# Patient Record
Sex: Female | Born: 1945
Health system: Southern US, Community
[De-identification: ages and names within clinical notes are randomized; demographics above are authoritative.]

## PROBLEM LIST (undated history)

## (undated) DIAGNOSIS — C482 Malignant neoplasm of peritoneum, unspecified: Secondary | ICD-10-CM

## (undated) DIAGNOSIS — M810 Age-related osteoporosis without current pathological fracture: Secondary | ICD-10-CM

## (undated) DIAGNOSIS — E079 Disorder of thyroid, unspecified: Secondary | ICD-10-CM

## (undated) DIAGNOSIS — C50919 Malignant neoplasm of unspecified site of unspecified female breast: Secondary | ICD-10-CM

## (undated) DIAGNOSIS — E7800 Pure hypercholesterolemia, unspecified: Secondary | ICD-10-CM

## (undated) DIAGNOSIS — K56609 Unspecified intestinal obstruction, unspecified as to partial versus complete obstruction: Secondary | ICD-10-CM

## (undated) DIAGNOSIS — H269 Unspecified cataract: Secondary | ICD-10-CM

## (undated) DIAGNOSIS — D126 Benign neoplasm of colon, unspecified: Secondary | ICD-10-CM

## (undated) DIAGNOSIS — Z1371 Encounter for nonprocreative screening for genetic disease carrier status: Secondary | ICD-10-CM

## (undated) DIAGNOSIS — K219 Gastro-esophageal reflux disease without esophagitis: Secondary | ICD-10-CM

## (undated) HISTORY — DX: Benign neoplasm of colon, unspecified: D12.6

## (undated) HISTORY — DX: Disorder of thyroid, unspecified: E07.9

## (undated) HISTORY — PX: COLONOSCOPY: SHX174

## (undated) HISTORY — PX: MASTECTOMY: SHX3

## (undated) HISTORY — DX: Malignant neoplasm of unspecified site of unspecified female breast: C50.919

## (undated) HISTORY — DX: Gastro-esophageal reflux disease without esophagitis: K21.9

## (undated) HISTORY — DX: Age-related osteoporosis without current pathological fracture: M81.0

## (undated) HISTORY — DX: Unspecified cataract: H26.9

## (undated) HISTORY — PX: OTHER SURGICAL HISTORY: SHX169

## (undated) HISTORY — DX: Malignant neoplasm of peritoneum, unspecified: C48.2

## (undated) HISTORY — DX: Unspecified intestinal obstruction, unspecified as to partial versus complete obstruction: K56.609

## (undated) HISTORY — PX: UPPER GASTROINTESTINAL ENDOSCOPY: SHX188

## (undated) HISTORY — DX: Pure hypercholesterolemia, unspecified: E78.00

## (undated) HISTORY — DX: Encounter for nonprocreative screening for genetic disease carrier status: Z13.71

---

## 2001-09-08 HISTORY — PX: BREAST LUMPECTOMY: SHX2

## 2002-03-10 ENCOUNTER — Other Ambulatory Visit: Admission: RE | Admit: 2002-03-10 | Discharge: 2002-03-10 | Payer: Self-pay | Admitting: General Surgery

## 2002-03-23 ENCOUNTER — Encounter: Admission: RE | Admit: 2002-03-23 | Discharge: 2002-03-23 | Payer: Self-pay | Admitting: General Surgery

## 2002-03-23 ENCOUNTER — Encounter: Payer: Self-pay | Admitting: General Surgery

## 2002-03-24 ENCOUNTER — Ambulatory Visit (HOSPITAL_BASED_OUTPATIENT_CLINIC_OR_DEPARTMENT_OTHER): Admission: RE | Admit: 2002-03-24 | Discharge: 2002-03-24 | Payer: Self-pay | Admitting: General Surgery

## 2002-03-24 ENCOUNTER — Encounter (INDEPENDENT_AMBULATORY_CARE_PROVIDER_SITE_OTHER): Payer: Self-pay | Admitting: *Deleted

## 2002-03-24 ENCOUNTER — Encounter: Payer: Self-pay | Admitting: General Surgery

## 2002-05-03 ENCOUNTER — Ambulatory Visit: Admission: RE | Admit: 2002-05-03 | Discharge: 2002-07-06 | Payer: Self-pay | Admitting: *Deleted

## 2003-06-21 ENCOUNTER — Other Ambulatory Visit: Admission: RE | Admit: 2003-06-21 | Discharge: 2003-06-21 | Payer: Self-pay | Admitting: Dermatology

## 2004-05-22 ENCOUNTER — Other Ambulatory Visit: Admission: RE | Admit: 2004-05-22 | Discharge: 2004-05-22 | Payer: Self-pay | Admitting: Dermatology

## 2005-10-22 ENCOUNTER — Other Ambulatory Visit: Admission: RE | Admit: 2005-10-22 | Discharge: 2005-10-22 | Payer: Self-pay | Admitting: Dermatology

## 2007-05-17 ENCOUNTER — Ambulatory Visit: Payer: Self-pay | Admitting: Gastroenterology

## 2007-05-31 ENCOUNTER — Ambulatory Visit: Payer: Self-pay | Admitting: Gastroenterology

## 2011-01-24 NOTE — Op Note (Signed)
[place]. [facility]  Patient:    Andrea Simpson, Andrea Simpson Visit Number: [id number] MRN: [id number]          Service Type: DSU Location: [facility] Attending Physician:  Carson Myrtle Dictated by:   Sheppard Plumber Earlene Plater, M.D. Proc. Date: 03/24/02 Admit Date:  03/24/2002 Discharge Date: 03/24/2002   CC:         Jeralyn Ruths, M.D.   Operative Report  PREOPERATIVE DIAGNOSIS:  Carcinoma, right breast.  POSTOPERATIVE DIAGNOSIS:  Carcinoma, right breast.  OPERATION PERFORMED:  Partial mastectomy right with pathology and margin management, sentinel lymph node biopsy, right with injection of Lymphazurin blue and lymph node mapping.  SURGEON:  Timothy E. Earlene Plater, M.D.  ANESTHESIA:  General.  INDICATIONS FOR PROCEDURE:  The patient is healthy, 78, just retired.  Has no personal health history.  She does have a history of fibrocystic disease and macrocysts.  She visited the office last week for a new mass in the right breast and aspiration cytology was done and was returned as ductal carcinoma. She and her family have been carefully counseled and she wishes to proceed with this proposed surgery.  She has had mammograms surprisingly which are both normal, right and left.  She has had a chest x-ray, chemistry profile and CBC, all of which were normal.  The patient was seen this morning by interventional radiology for injection of technetium in and around the right nipple area.  DESCRIPTION OF PROCEDURE:  The patient was seen and identified and the breast was marked by me and then taken to the operating room and general LMA anesthesia provided.  The right breast was carefully examined, the tumor in the 9 oclock position 6 cm outside the areolar edge was marked and the axilla was marked.  The Lymphazurin blue dye was then injected intradermally around and about the nipple areolar complex.  This was massaged for five minutes. Then the breast was carefully prepped and  draped in the usual fashion.  I approached the tumor in the breast by making a generous incision in the skin lines of the breast, dissected both the superficial and deep subcutaneous fat, grasped the tumor with the surrounding tissue and fat and completely excised the tumor mass from the surrounding tissue.  This was done sharply and without cautery.  It was removed, carefully marked and sent to pathology.  The remaining cavity was carefully examined.  Bleeding was controlled with the cautery and the wound was dry.  It was left for observation.  We then cleansed the skin and probed the right axilla with the Neoprobe, found one area high in the axilla with counts higher than background.  A small incision was made in the midaxilla, careful dissection into the axillary space and then careful direction by using the Neoprobe guided me to one single cluster of lymph nodes which were both stained and hot.  These were grasped and sharply and bluntly dissected from the surrounding tissue and removed from the axilla.  Cautery was used when necessary.  No nerve structures were encountered and the two lymph nodes together were sent to pathology marked as specimen #1, lymph node mass right axilla hot and blue.  The axilla was checked there was no bleeding. By then we had a report back from Dr. Delila Spence that the Touch Preps on the actual breast tumor were negative.  So, we began closing the partial mastectomy site with 3-0 Monocryl, 5-0 plain and 4-0 Monocryl.  Steri-Strips were applied.  By then  we had a report back that the sentinel nodes were also negative.  The axillary wound was closed in a like fashion.  The counts were all correct.  She tolerated it well.  Steri-Strips and dry sterile dressing were applied.  She was removed to recovery room in good condition.  The available information was transmitted to her family.  As well, she was given prescriptions for Phenergan for nausea, Percocet for pain.   She will be seen and followed as an outpatient. Dictated by:   Sheppard Plumber Earlene Plater, M.D. Attending Physician:  Carson Myrtle DD:  03/24/02 TD:  03/29/02 Job: [id number]

## 2012-03-15 ENCOUNTER — Encounter: Payer: Self-pay | Admitting: Gastroenterology

## 2012-05-03 ENCOUNTER — Ambulatory Visit (AMBULATORY_SURGERY_CENTER): Payer: Medicare Other

## 2012-05-03 VITALS — Ht 65.5 in | Wt 125.0 lb

## 2012-05-03 DIAGNOSIS — Z1211 Encounter for screening for malignant neoplasm of colon: Secondary | ICD-10-CM

## 2012-05-03 MED ORDER — MOVIPREP 100 G PO SOLR: 1.0000 | Freq: Once | ORAL | Status: DC

## 2012-05-17 ENCOUNTER — Encounter: Payer: Self-pay | Admitting: Gastroenterology

## 2012-05-27 ENCOUNTER — Encounter: Payer: Medicare Other | Admitting: Hematology and Oncology

## 2012-05-27 DIAGNOSIS — M899 Disorder of bone, unspecified: Secondary | ICD-10-CM

## 2012-05-27 DIAGNOSIS — M949 Disorder of cartilage, unspecified: Secondary | ICD-10-CM

## 2012-05-27 DIAGNOSIS — Z170 Estrogen receptor positive status [ER+]: Secondary | ICD-10-CM

## 2012-05-27 DIAGNOSIS — C50419 Malignant neoplasm of upper-outer quadrant of unspecified female breast: Secondary | ICD-10-CM

## 2012-05-27 DIAGNOSIS — M810 Age-related osteoporosis without current pathological fracture: Secondary | ICD-10-CM

## 2012-05-31 ENCOUNTER — Ambulatory Visit (AMBULATORY_SURGERY_CENTER): Payer: Medicare Other | Admitting: Gastroenterology

## 2012-05-31 VITALS — BP 148/74 | HR 75 | Temp 98.2°F | Resp 18 | Ht 65.5 in | Wt 122.0 lb

## 2012-05-31 DIAGNOSIS — Z800 Family history of malignant neoplasm of digestive organs: Secondary | ICD-10-CM

## 2012-05-31 DIAGNOSIS — Z1211 Encounter for screening for malignant neoplasm of colon: Secondary | ICD-10-CM

## 2012-05-31 MED ORDER — SODIUM CHLORIDE 0.9 % IV SOLN: 500.0000 mL | INTRAVENOUS | Status: DC

## 2012-05-31 NOTE — Op Note (Signed)
[facility] [address] [place], [address]   COLONOSCOPY PROCEDURE REPORT  PATIENT: Andrea Simpson, Andrea Simpson  MR#: [id number] BIRTHDATE: [birth year] , 66  yrs. old GENDER: Female ENDOSCOPIST: Mardella Layman, MD, Clementeen Graham REFERRED BY:  Eli Hose, M.D. PROCEDURE DATE:  05/31/2012 PROCEDURE:   Colonoscopy, diagnostic ASA CLASS:   Class II INDICATIONS:patient's immediate family history of colon cancer. MEDICATIONS: propofol (Diprivan) 150mg  IV  DESCRIPTION OF PROCEDURE:   After the risks and benefits and of the procedure were explained, informed consent was obtained.  A digital rectal exam revealed no abnormalities of the rectum.    The LB CF-[id number]  endoscope was introduced through the anus and advanced to the cecum, which was identified by both the appendix and ileocecal valve .  The quality of the prep was excellent, using MoviPrep .  The instrument was then slowly withdrawn as the colon was fully examined.     COLON FINDINGS: A normal appearing cecum, ileocecal valve, and appendiceal orifice were identified.  The ascending, hepatic flexure, transverse, splenic flexure, descending, sigmoid colon and rectum appeared unremarkable.  No polyps or cancers were seen. Retroflexed views revealed no abnormalities. Promonite perianal papillae noted.    The scope was then withdrawn from the patient and the procedure completed.  COMPLICATIONS: There were no complications. ENDOSCOPIC IMPRESSION:No polyps or cancer noted.... Normal colon  RECOMMENDATIONS: Given your significant family history of colon cancer, you should have a repeat colonoscopy in 5 years   REPEAT EXAM:  cc:  _______________________________ eSignedMardella Layman, MD, [facility] 05/31/2012 9:04 AM

## 2012-05-31 NOTE — Patient Instructions (Signed)
YOU HAD AN ENDOSCOPIC PROCEDURE TODAY AT THE [facility]: Refer to the procedure report that was given to you for any specific questions about what was found during the examination.  If the procedure report does not answer your questions, please call your gastroenterologist to clarify.  If you requested that your care partner not be given the details of your procedure findings, then the procedure report has been included in a sealed envelope for you to review at your convenience later.  YOU SHOULD EXPECT: Some feelings of bloating in the abdomen. Passage of more gas than usual.  Walking can help get rid of the air that was put into your GI tract during the procedure and reduce the bloating. If you had a lower endoscopy (such as a colonoscopy or flexible sigmoidoscopy) you may notice spotting of blood in your stool or on the toilet paper. If you underwent a bowel prep for your procedure, then you may not have a normal bowel movement for a few days.  DIET: Your first meal following the procedure should be a light meal and then it is ok to progress to your normal diet.  A half-sandwich or bowl of soup is an example of a good first meal.  Heavy or fried foods are harder to digest and may make you feel nauseous or bloated.  Likewise meals heavy in dairy and vegetables can cause extra gas to form and this can also increase the bloating.  Drink plenty of fluids but you should avoid alcoholic beverages for 24 hours.  ACTIVITY: Your care partner should take you home directly after the procedure.  You should plan to take it easy, moving slowly for the rest of the day.  You can resume normal activity the day after the procedure however you should NOT DRIVE or use heavy machinery for 24 hours (because of the sedation medicines used during the test).    SYMPTOMS TO REPORT IMMEDIATELY: A gastroenterologist can be reached at any hour.  During normal business hours, 8:30 AM to 5:00 PM Monday through Friday,  call [phone].  After hours and on weekends, please call the GI answering service at [phone] who will take a message and have the physician on call contact you.   Following lower endoscopy (colonoscopy or flexible sigmoidoscopy):  Excessive amounts of blood in the stool  Significant tenderness or worsening of abdominal pains  Swelling of the abdomen that is new, acute  Fever of 100F or higher    FOLLOW UP: If any biopsies were taken you will be contacted by phone or by letter within the next 1-3 weeks.  Call your gastroenterologist if you have not heard about the biopsies in 3 weeks.  Our staff will call the home number listed on your records the next business day following your procedure to check on you and address any questions or concerns that you may have at that time regarding the information given to you following your procedure. This is a courtesy call and so if there is no answer at the home number and we have not heard from you through the emergency physician on call, we will assume that you have returned to your regular daily activities without incident.  SIGNATURES/CONFIDENTIALITY: You and/or your care partner have signed paperwork which will be entered into your electronic medical record.  These signatures attest to the fact that that the information above on your After Visit Summary has been reviewed and is understood.  Full responsibility of the confidentiality   of this discharge information lies with you and/or your care-partner.     

## 2012-05-31 NOTE — Progress Notes (Signed)
Patient did not experience any of the following events: a burn prior to discharge; a fall within the facility; wrong site/side/patient/procedure/implant event; or a hospital transfer or hospital admission upon discharge from the facility. (G8907) Patient did not have preoperative order for IV antibiotic SSI prophylaxis. (G8918)  

## 2012-06-01 ENCOUNTER — Telehealth: Payer: Self-pay | Admitting: *Deleted

## 2012-06-01 DIAGNOSIS — C50919 Malignant neoplasm of unspecified site of unspecified female breast: Secondary | ICD-10-CM

## 2012-06-01 DIAGNOSIS — M810 Age-related osteoporosis without current pathological fracture: Secondary | ICD-10-CM

## 2012-06-01 NOTE — Telephone Encounter (Signed)
  Follow up Call-  Call back number 05/31/2012  Post procedure Call Back phone  # [phone] yes  Permission to leave phone message Yes     Patient questions:  Do you have a fever, pain , or abdominal swelling? no Pain Score  0 *  Have you tolerated food without any problems? yes  Have you been able to return to your normal activities? yes  Do you have any questions about your discharge instructions: Diet   no Medications  no Follow up visit  no  Do you have questions or concerns about your Care? no  Actions: * If pain score is 4 or above: No action needed, pain <4.

## 2013-09-08 DIAGNOSIS — C482 Malignant neoplasm of peritoneum, unspecified: Secondary | ICD-10-CM

## 2013-09-08 HISTORY — PX: OTHER SURGICAL HISTORY: SHX169

## 2013-09-08 HISTORY — DX: Malignant neoplasm of peritoneum, unspecified: C48.2

## 2013-09-08 HISTORY — PX: TOTAL ABDOMINAL HYSTERECTOMY W/ BILATERAL SALPINGOOPHORECTOMY: SHX83

## 2014-02-08 DIAGNOSIS — C482 Malignant neoplasm of peritoneum, unspecified: Secondary | ICD-10-CM | POA: Insufficient documentation

## 2014-05-30 DIAGNOSIS — T451X5A Adverse effect of antineoplastic and immunosuppressive drugs, initial encounter: Secondary | ICD-10-CM

## 2014-05-30 DIAGNOSIS — D701 Agranulocytosis secondary to cancer chemotherapy: Secondary | ICD-10-CM | POA: Insufficient documentation

## 2014-11-01 ENCOUNTER — Encounter: Payer: Self-pay | Admitting: Podiatry

## 2014-11-01 ENCOUNTER — Ambulatory Visit (INDEPENDENT_AMBULATORY_CARE_PROVIDER_SITE_OTHER): Payer: Medicare Other | Admitting: Podiatry

## 2014-11-01 VITALS — BP 100/70 | HR 96 | Resp 12

## 2014-11-01 DIAGNOSIS — L03012 Cellulitis of left finger: Secondary | ICD-10-CM | POA: Diagnosis not present

## 2014-11-01 NOTE — Progress Notes (Signed)
   Subjective:    Patient ID: Andrea Simpson, female    DOB: December 27, 1945, 69 y.o.   MRN: [id number]  HPI  PT STATED B/L GREAT TOENAIL BEEN PAINFUL 1 MONTH. THE TOENAILS ARE GETTING THICKER AND WORSE. TOENAILS GET AGGRAVATED BY WEARING SHOES. TRIED TO GET PEDICURE TO TRIM IT DOWN IT HELP SOME.  Review of Systems  Skin: Positive for color change.  All other systems reviewed and are negative.      Objective:   Physical Exam        Assessment & Plan:

## 2014-11-01 NOTE — Patient Instructions (Signed)

## 2015-04-24 DIAGNOSIS — Z1371 Encounter for nonprocreative screening for genetic disease carrier status: Secondary | ICD-10-CM | POA: Insufficient documentation

## 2016-04-30 ENCOUNTER — Other Ambulatory Visit (HOSPITAL_BASED_OUTPATIENT_CLINIC_OR_DEPARTMENT_OTHER): Payer: Medicare Other

## 2016-04-30 ENCOUNTER — Encounter: Payer: Self-pay | Admitting: Gynecologic Oncology

## 2016-04-30 ENCOUNTER — Ambulatory Visit: Payer: Medicare Other | Attending: Gynecologic Oncology | Admitting: Gynecologic Oncology

## 2016-04-30 VITALS — BP 135/69 | HR 64 | Temp 99.0°F | Resp 18 | Wt 129.4 lb

## 2016-04-30 DIAGNOSIS — Z923 Personal history of irradiation: Secondary | ICD-10-CM | POA: Diagnosis not present

## 2016-04-30 DIAGNOSIS — Z7982 Long term (current) use of aspirin: Secondary | ICD-10-CM | POA: Diagnosis not present

## 2016-04-30 DIAGNOSIS — Z888 Allergy status to other drugs, medicaments and biological substances status: Secondary | ICD-10-CM | POA: Diagnosis not present

## 2016-04-30 DIAGNOSIS — K219 Gastro-esophageal reflux disease without esophagitis: Secondary | ICD-10-CM | POA: Insufficient documentation

## 2016-04-30 DIAGNOSIS — Z800 Family history of malignant neoplasm of digestive organs: Secondary | ICD-10-CM | POA: Diagnosis not present

## 2016-04-30 DIAGNOSIS — Z880 Allergy status to penicillin: Secondary | ICD-10-CM | POA: Diagnosis not present

## 2016-04-30 DIAGNOSIS — Z170 Estrogen receptor positive status [ER+]: Secondary | ICD-10-CM | POA: Diagnosis not present

## 2016-04-30 DIAGNOSIS — E7800 Pure hypercholesterolemia, unspecified: Secondary | ICD-10-CM | POA: Insufficient documentation

## 2016-04-30 DIAGNOSIS — M810 Age-related osteoporosis without current pathological fracture: Secondary | ICD-10-CM | POA: Diagnosis not present

## 2016-04-30 DIAGNOSIS — Z881 Allergy status to other antibiotic agents status: Secondary | ICD-10-CM | POA: Insufficient documentation

## 2016-04-30 DIAGNOSIS — Z90710 Acquired absence of both cervix and uterus: Secondary | ICD-10-CM | POA: Insufficient documentation

## 2016-04-30 DIAGNOSIS — Z87891 Personal history of nicotine dependence: Secondary | ICD-10-CM | POA: Insufficient documentation

## 2016-04-30 DIAGNOSIS — Z853 Personal history of malignant neoplasm of breast: Secondary | ICD-10-CM | POA: Diagnosis not present

## 2016-04-30 DIAGNOSIS — C482 Malignant neoplasm of peritoneum, unspecified: Secondary | ICD-10-CM | POA: Insufficient documentation

## 2016-04-30 DIAGNOSIS — Z9221 Personal history of antineoplastic chemotherapy: Secondary | ICD-10-CM | POA: Diagnosis not present

## 2016-04-30 DIAGNOSIS — Z90722 Acquired absence of ovaries, bilateral: Secondary | ICD-10-CM | POA: Insufficient documentation

## 2016-04-30 DIAGNOSIS — Z9011 Acquired absence of right breast and nipple: Secondary | ICD-10-CM | POA: Insufficient documentation

## 2016-04-30 DIAGNOSIS — E785 Hyperlipidemia, unspecified: Secondary | ICD-10-CM | POA: Insufficient documentation

## 2016-04-30 NOTE — Patient Instructions (Signed)
We will contact you with the results of your CA 125 from today.  Plan to have a CT scan in three months with a follow up appointment with Dr. Alycia Rossetti at that time.  Please call for any new symptoms, concerns, or questions.

## 2016-04-30 NOTE — Progress Notes (Signed)
Consult Note: Gyn-Onc  Andrea Simpson 70 y.o. female  CC:  Chief Complaint  Patient presents with  . Peritoneal cancer    New Consultation    HPI: Patient is seen today as transferred care at the request of Dr. Tressie Stalker. Primary physician: [name].  Patient is a 70 year old gravida 0 who was diagnosed with a pelvic mass and an elevated CA-125 (60s per patient report) in May 2015. On 01/10/2014 she underwent a TAH/BSO. Findings included what appeared to be bilateral normal ovaries with an inflammatory mass involving the back of the uterus, the right round ligament and towards the right pelvic sidewall. The mass was apparently sent for frozen section was felt to be benign. Final pathology however revealed an adenocarcinoma with implants and soft tissue posterior to the uterus. Per the pathology report, the adnexa and fallopian tubes were negative. The tissue posterior to the uterus was a clear cell carcinoma. The tumor was ER negative, CK 7 positive. This was felt to be most consistent with a clear cell carcinoma.   Per the patient's report, she then developed a partial small bowel obstruction and went back to the operating room on May 13. At that time omentum was excised that was benign. She's been free of disease since that time.   She's had CA-125 was performed on a regular basis. Her most recent CA-125 June 27 was 10.1. She's also been undergoing annual CT scans of the abdomen and pelvis in November of every year. Her most recent CT scan 07/23/2015 revealed no evidence of peritoneal lesions or ascites. The patient was treated with 6 cycles of paclitaxel and carboplatin based chemotherapy which she completed from 03/27/2014 to 07/10/2014. She then underwent right pelvic radiation from 08/15/2014 to January 19 2016th. She has not been having routine pelvic exams and was somewhat shocked that she would have one today. She did have genetic testing and per her report she is BRCA negative. The  patient does have a personal history of breast cancer back in 2013. She underwent a right partial mastectomy which revealed invasive ductal carcinoma measuring 1.5 cm. The tumor was grade 3 with 2 negative sentinel lymph nodes. Tumor was ER positive PR negative HER-2 positive. Ki-67 marker was 20%. The patient declined chemotherapy and targeted therapy. She did undergo radiation to the right breast with 5040 cGy. She then took Arimidex for 5 years which she completed in October 2008.  Interval History:  She was last seen by her medical oncologist June 2017. At that time her review of systems and exam was unremarkable. CA-125 was normal. She scheduled for routine colonoscopy in September 2018.  Review of Systems: Constitutional: Denies fevers. Has 2-3 hot flashes per day. There is no pattern. She denies any night sweats. Skin: No rash Cardiovascular: No chest pain, shortness of breath, or edema  Pulmonary: No cough.  Gastro Intestinal: No nausea, vomiting, constipation, or diarrhea reported. No change in bowel movement. She denies early satiety or abdominal bloating. She states that she has 5 pounds that she cannot lose. She feels "thick" through the abdomen. She states her abdominal silhouette is not what it was many years ago. Genitourinary: Denies vaginal bleeding and discharge.  Musculoskeletal: No joint swelling or pain.  Neurologic: Persistent neuropathy in her nails of her fingers and in her feet. She has not. Her stools. She states she has very poor balance but then admits that this predated chemotherapy Psychology: No changes   Current Meds:  Outpatient Encounter Prescriptions as of  04/30/2016  Medication Sig  . aspirin 81 MG tablet Take 81 mg by mouth daily.  . Calcium Carbonate-Vitamin D (CALTRATE 600+D) 600-400 MG-UNIT per tablet Take 1 tablet by mouth daily.  Marland Kitchen EVISTA 60 MG tablet   . Multiple Vitamin (MULTIVITAMIN) tablet Take 1 tablet by mouth daily.  Marland Kitchen omeprazole (PRILOSEC) 20 MG  capsule daily as needed.   . simvastatin (ZOCOR) 10 MG tablet Take 10 mg by mouth at bedtime.  Marland Kitchen azelastine (ASTELIN) 0.1 % nasal spray Place into the nose 3 times/day as needed-between meals & bedtime.   . polyethylene glycol (MIRALAX / GLYCOLAX) packet Take by mouth.  . [DISCONTINUED] vitamin E 400 UNIT capsule Take 400 Units by mouth daily.  . [DISCONTINUED] Zoledronic Acid (ZOMETA IV) Inject into the vein.   No facility-administered encounter medications on file as of 04/30/2016.     Allergy:  Allergies  Allergen Reactions  . Penicillins     Felt like "pins and needles" on skin  . Tramadol Dermatitis    Itching , cough   . Keflex [Cephalexin] Rash    Social Hx:   Social History   Social History  . Marital status: Single    Spouse name: N/A  . Number of children: N/A  . Years of education: N/A   Occupational History  . Not on file.   Social History Main Topics  . Smoking status: Former Smoker    Quit date: 05/04/1978  . Smokeless tobacco: Never Used  . Alcohol use No  . Drug use: No  . Sexual activity: Not on file   Other Topics Concern  . Not on file   Social History Narrative  . No narrative on file    Past Surgical Hx:  Past Surgical History:  Procedure Laterality Date  . BREAST LUMPECTOMY     bilateral  . MASTECTOMY     [id number] right    Past Medical Hx:  Past Medical History:  Diagnosis Date  . BRCA negative   . Breast cancer ([place])   . GERD (gastroesophageal reflux disease)   . Hypercholesterolemia   . Osteoporosis     Oncology Hx:    Primary peritoneal carcinomatosis ([place])   01/10/2014 Initial Diagnosis    Primary peritoneal carcinomatosis ([place]), IIC      03/27/2014 - 07/10/2014 Chemotherapy    Status post 6 cycles of paclitaxel and carboplatin.       08/15/2014 - 09/26/2014 Radiation Therapy    5040 cGy in 28 fractions to the right pelvis.       Family Hx:  Family History  Problem Relation Age of Onset  . Colon cancer Father      Vitals:  Blood pressure 135/69, pulse 64, temperature 99 F (37.2 C), temperature source Oral, resp. rate 18, weight 129 lb 6.4 oz (58.7 kg), SpO2 99 %.  Physical Exam: Well-nourished well-developed female in no acute distress.  Neck: Supple, no no lymphadenopathy, no thyromegaly.  Lungs: Clear to auscultation bilaterally.  Cardiovascular: Regular rate and rhythm.  Abdomen: Well-healed vertical midline incision. Abdomen is soft, nontender, nondistended. There are no palpable masses or hepatosplenomegaly.  Groins: No lymphadenopathy.  Extremities no edema.  Pelvic: External genitalia within normal limits. Unidigital exam was performed. There are no masses or nodularity. The patient tolerated the exam very poorly. We could not perform a rectal secondary to patient tolerance.  Assessment/Plan: 70 year old with a history of both breast cancer as well as a presumed stage IIc primary peritoneal carcinoma. She was not adequately  staged. There was tumor on the posterior aspect of the uterus based on the operative note. This was consistent with clear cell carcinoma but the omentum was negative. There is no lymph node staging. She underwent 6 cycles of chemotherapy with paclitaxel and carboplatin followed by pelvic radiation therapy. She's been free of disease based primarily on CA-125's and CT scans.  We'll follow-up in results of her CA-125 from today. She'll return to see me in 3 months. It is her expectation that she would have a CT scan in November eery year for 5 years as that is the plan that was outlined by her prior medical oncologist. We will order CT scan for November.  I discussed with her the pelvic exams a part of the follow-up for ovarian cancer from an oncologic perspective. That we would attempt to perform a nature of her visits with her permission of course.  Her questions were elicited in answer to her satisfaction.  Raye Slyter A., MD 04/30/2016, 3:29 PM

## 2016-05-01 ENCOUNTER — Telehealth: Payer: Self-pay | Admitting: Gynecologic Oncology

## 2016-05-01 LAB — CA 125: Cancer Antigen (CA) 125: 10.3 U/mL (ref 0.0–38.1)

## 2016-05-01 NOTE — Telephone Encounter (Signed)
Left message for patient with CA 125 results.  Advised to call for any needs.

## 2016-07-18 ENCOUNTER — Encounter (HOSPITAL_COMMUNITY): Payer: Self-pay

## 2016-07-18 ENCOUNTER — Ambulatory Visit (HOSPITAL_COMMUNITY)
Admission: RE | Admit: 2016-07-18 | Discharge: 2016-07-18 | Disposition: A | Discharge status: Home / Self Care | Payer: Medicare Other | Source: Ambulatory Visit | Attending: Gynecologic Oncology | Admitting: Gynecologic Oncology

## 2016-07-18 DIAGNOSIS — K769 Liver disease, unspecified: Secondary | ICD-10-CM | POA: Diagnosis not present

## 2016-07-18 DIAGNOSIS — C482 Malignant neoplasm of peritoneum, unspecified: Secondary | ICD-10-CM

## 2016-07-18 DIAGNOSIS — Z08 Encounter for follow-up examination after completed treatment for malignant neoplasm: Secondary | ICD-10-CM | POA: Diagnosis not present

## 2016-07-18 DIAGNOSIS — Z8589 Personal history of malignant neoplasm of other organs and systems: Secondary | ICD-10-CM | POA: Diagnosis not present

## 2016-07-18 LAB — POCT I-STAT CREATININE: CREATININE: 0.9 mg/dL (ref 0.44–1.00)

## 2016-07-18 MED ORDER — IOPAMIDOL (ISOVUE-300) INJECTION 61%
100.0000 mL | Freq: Once | INTRAVENOUS | Status: AC | PRN
  Administered 2016-07-18: 100 mL via INTRAVENOUS

## 2016-07-22 ENCOUNTER — Other Ambulatory Visit: Payer: Medicare Other

## 2016-07-22 ENCOUNTER — Encounter: Payer: Self-pay | Admitting: Gynecologic Oncology

## 2016-07-22 ENCOUNTER — Ambulatory Visit: Payer: Medicare Other | Attending: Gynecologic Oncology | Admitting: Gynecologic Oncology

## 2016-07-22 ENCOUNTER — Other Ambulatory Visit: Payer: Self-pay | Admitting: Gynecologic Oncology

## 2016-07-22 VITALS — BP 142/77 | HR 86 | Temp 98.6°F | Resp 20 | Ht 65.5 in | Wt 129.7 lb

## 2016-07-22 DIAGNOSIS — C482 Malignant neoplasm of peritoneum, unspecified: Secondary | ICD-10-CM

## 2016-07-22 DIAGNOSIS — E7800 Pure hypercholesterolemia, unspecified: Secondary | ICD-10-CM | POA: Diagnosis not present

## 2016-07-22 DIAGNOSIS — Z87891 Personal history of nicotine dependence: Secondary | ICD-10-CM | POA: Insufficient documentation

## 2016-07-22 DIAGNOSIS — M810 Age-related osteoporosis without current pathological fracture: Secondary | ICD-10-CM | POA: Diagnosis not present

## 2016-07-22 DIAGNOSIS — Z8589 Personal history of malignant neoplasm of other organs and systems: Secondary | ICD-10-CM | POA: Diagnosis not present

## 2016-07-22 DIAGNOSIS — Z7982 Long term (current) use of aspirin: Secondary | ICD-10-CM | POA: Insufficient documentation

## 2016-07-22 DIAGNOSIS — C569 Malignant neoplasm of unspecified ovary: Secondary | ICD-10-CM

## 2016-07-22 DIAGNOSIS — Z853 Personal history of malignant neoplasm of breast: Secondary | ICD-10-CM | POA: Insufficient documentation

## 2016-07-22 DIAGNOSIS — K219 Gastro-esophageal reflux disease without esophagitis: Secondary | ICD-10-CM | POA: Insufficient documentation

## 2016-07-22 NOTE — Patient Instructions (Signed)
We will notify you of the results of your CA-125. Please return to see [place] in 4 months. Happy holidays!

## 2016-07-22 NOTE — Progress Notes (Signed)
Consult Note: Gyn-Onc  Andrea Simpson 70 y.o. female  CC:  Chief Complaint  Patient presents with  . peritoneal carcinoma ( [place] )    Follow up    HPI: Patient is seen today as transferred care at the request of Dr. Tressie Stalker. Primary physician: [name].  Patient is a 70 year old gravida 0 who was diagnosed with a pelvic mass and an elevated CA-125 (60s per patient report) in May 2015. On 01/10/2014 she underwent a TAH/BSO. Findings included what appeared to be bilateral normal ovaries with an inflammatory mass involving the back of the uterus, the right round ligament and towards the right pelvic sidewall. The mass was apparently sent for frozen section was felt to be benign. Final pathology however revealed an adenocarcinoma with implants and soft tissue posterior to the uterus. Per the pathology report, the adnexa and fallopian tubes were negative. The tissue posterior to the uterus was a clear cell carcinoma. The tumor was ER negative, CK 7 positive. This was felt to be most consistent with a clear cell carcinoma.   Per the patient's report, she then developed a partial small bowel obstruction and went back to the operating room on May 13. At that time omentum was excised that was benign. She's been free of disease since that time.   She's had CA-125 was performed on a regular basis. Her most recent CA-125 June 27 was 10.1. She's also been undergoing annual CT scans of the abdomen and pelvis in November of every year. Her most recent CT scan 07/23/2015 revealed no evidence of peritoneal lesions or ascites. The patient was treated with 6 cycles of paclitaxel and carboplatin based chemotherapy which she completed from 03/27/2014 to 07/10/2014. She then underwent right pelvic radiation from 08/15/2014 to January 19 2016th. She has not been having routine pelvic exams and was somewhat shocked that she would have one today. She did have genetic testing and per her report she is BRCA negative. The  patient does have a personal history of breast cancer back in 2013. She underwent a right partial mastectomy which revealed invasive ductal carcinoma measuring 1.5 cm. The tumor was grade 3 with 2 negative sentinel lymph nodes. Tumor was ER positive PR negative HER-2 positive. Ki-67 marker was 20%. The patient declined chemotherapy and targeted therapy. She did undergo radiation to the right breast with 5040 cGy. She then took Arimidex for 5 years which she completed in October 2008.  Interval History:  I last saw her 8/17. Her CA-125 was 10.3. CT 07/18/16:  IMPRESSION: Stable exam. No evidence of recurrent or metastatic disease within the abdomen or pelvis.  She's overall doing fairly well. She states that she "feels heavier" through the middle. She had her thyroid checked which was normal. She is wondering if thyroid replacement might help with her metabolism. Her bone density is improved. She had a bone density study done in October that showed that her back is now only at the stage of osteopenia though she continues to have osteoporosis in her femurs. She does exercise 3-4 days a week and we'll do a mile on a bike and a mile on the treadmill and then do some weight lifting. She states that she did qualify for cataract surgery but wants to put it off at this time. She'll be spending the holidays with her sister.  Review of Systems: Constitutional: Denies fevers, chills or headaches Skin: No rash Cardiovascular: No chest pain, shortness of breath, or edema  Pulmonary: No cough.  [name]  Intestinal: No nausea, vomiting, constipation, or diarrhea reported. No change in bowel movement. She denies early satiety or abdominal bloating. She feels "thick" through the abdomen.  Genitourinary: Denies vaginal bleeding and discharge.  Musculoskeletal: No joint swelling or pain.  Neurologic: Persistent neuropathy in her nails of her fingers and in her feet.She states she has very poor balance and poor  flexibility but then admits that this predated chemotherapy Psychology: No changes   Current Meds:  Outpatient Encounter Prescriptions as of 07/22/2016  Medication Sig  . aspirin 81 MG tablet Take 81 mg by mouth daily.  . Calcium Carbonate-Vitamin D (CALTRATE 600+D) 600-400 MG-UNIT per tablet Take 1 tablet by mouth daily.  Marland Kitchen EVISTA 60 MG tablet   . Multiple Vitamin (MULTIVITAMIN) tablet Take 1 tablet by mouth daily.  . polyethylene glycol (MIRALAX / GLYCOLAX) packet Take by mouth.  . simvastatin (ZOCOR) 10 MG tablet Take 10 mg by mouth at bedtime.  Marland Kitchen azelastine (ASTELIN) 0.1 % nasal spray Place into the nose 3 times/day as needed-between meals & bedtime.   Marland Kitchen omeprazole (PRILOSEC) 20 MG capsule daily as needed.    No facility-administered encounter medications on file as of 07/22/2016.     Allergy:  Allergies  Allergen Reactions  . Penicillins     Felt like "pins and needles" on skin  . Tramadol Dermatitis    Itching , cough   . Keflex [Cephalexin] Rash    Social Hx:   Social History   Social History  . Marital status: Single    Spouse name: N/A  . Number of children: N/A  . Years of education: N/A   Occupational History  . Not on file.   Social History Main Topics  . Smoking status: Former Smoker    Quit date: 05/04/1978  . Smokeless tobacco: Never Used  . Alcohol use No  . Drug use: No  . Sexual activity: Not on file   Other Topics Concern  . Not on file   Social History Narrative  . No narrative on file    Past Surgical Hx:  Past Surgical History:  Procedure Laterality Date  . BREAST LUMPECTOMY     bilateral  . MASTECTOMY     [id number] right    Past Medical Hx:  Past Medical History:  Diagnosis Date  . BRCA negative   . Breast cancer ([place])   . GERD (gastroesophageal reflux disease)   . Hypercholesterolemia   . Osteoporosis     Oncology Hx:    Primary peritoneal carcinomatosis ([place])   01/10/2014 Initial Diagnosis    Primary peritoneal  carcinomatosis ([place]), IIC      03/27/2014 - 07/10/2014 Chemotherapy    Status post 6 cycles of paclitaxel and carboplatin.       08/15/2014 - 09/26/2014 Radiation Therapy    5040 cGy in 28 fractions to the right pelvis.       Family Hx:  Family History  Problem Relation Age of Onset  . Colon cancer Father     Vitals:  Blood pressure (!) 142/77, pulse 86, temperature 98.6 F (37 C), temperature source Oral, resp. rate 20, height 5' 5.5" (1.664 m), weight 129 lb 11.2 oz (58.8 kg), SpO2 100 %.  Physical Exam: Well-nourished well-developed female in no acute distress.  Neck: Supple, no no lymphadenopathy, no thyromegaly.  Lungs: Clear to auscultation bilaterally.  Cardiovascular: Regular rate and rhythm.  Abdomen: Well-healed vertical midline incision. Abdomen is soft, nontender, nondistended. There are no palpable masses or hepatosplenomegaly. No fluid  wave, no appreciable hernia.  Groins: No lymphadenopathy.  Extremities no edema.  Pelvic: External genitalia within normal limits. Unidigital exam was performed. There are no masses or nodularity. The patient tolerated the exam very poorly. We could not perform a rectal secondary to patient tolerance and refusal.  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 She was not adequately staged. There was tumor on the posterior aspect of the uterus based on the operative note. This was consistent with clear cell carcinoma but the omentum was negative. There is no lymph node staging. She underwent 6 cycles of chemotherapy with paclitaxel and carboplatin followed by pelvic radiation therapy. She's been free of disease based primarily on CA-125's and CT scans.  We'll follow-up in results of her CA-125 from today. She'll return to see me in 3 months. It is her expectation that she would have a CT scan in November every year for 5 years as that  is the plan that was outlined by her prior medical oncologist. We will order CT scan for November 2018.  She will follow-up with a personal trainer at her gym to see if there's any exercises they can do to increase her stamina, stability, flexibility.  She will follow-up with her other physicians as scheduled.  Her questions were elicited in answer to her satisfaction.  Nancy Marus A., MD 07/22/2016, 12:48 PM

## 2016-07-23 LAB — CA 125: Cancer Antigen (CA) 125: 15.2 U/mL (ref 0.0–38.1)

## 2016-07-24 ENCOUNTER — Telehealth: Payer: Self-pay

## 2016-07-24 NOTE — Telephone Encounter (Signed)
Orders received from [facility], APNP to contact the patient with CA 125 level of ( 15.2 ) being WNL . The patient was contacted and updated with her CA 125 level being "WNL". Patient states understanding, denies further questions at this time.

## 2016-07-30 ENCOUNTER — Ambulatory Visit (HOSPITAL_COMMUNITY): Payer: Medicare Other

## 2016-07-30 ENCOUNTER — Ambulatory Visit: Payer: Medicare Other | Admitting: Gynecologic Oncology

## 2016-11-04 ENCOUNTER — Other Ambulatory Visit: Payer: Self-pay | Admitting: Gynecologic Oncology

## 2016-11-04 DIAGNOSIS — C482 Malignant neoplasm of peritoneum, unspecified: Secondary | ICD-10-CM

## 2016-11-04 NOTE — Progress Notes (Signed)
Consult Note: Gyn-Onc  Andrea Simpson 71 y.o. female  CC:  Chief Complaint  Patient presents with  . Peritoneal Cancer    HPI: Patient is seen today as transferred care at the request of Dr. Tressie Stalker. Primary physician: [name].  Patient is a 71 year old gravida 0 who was diagnosed with a pelvic mass and an elevated CA-125 (60s per patient report) in May 2015. On 01/10/2014 she underwent a TAH/BSO. Findings included what appeared to be bilateral normal ovaries with an inflammatory mass involving the back of the uterus, the right round ligament and towards the right pelvic sidewall. The mass was apparently sent for frozen section was felt to be benign. Final pathology however revealed an adenocarcinoma with implants and soft tissue posterior to the uterus. Per the pathology report, the adnexa and fallopian tubes were negative. The tissue posterior to the uterus was a clear cell carcinoma. The tumor was ER negative, CK 7 positive. This was felt to be most consistent with a clear cell carcinoma.   Per the patient's report, she then developed a partial small bowel obstruction and went back to the operating room on May 13. At that time omentum was excised that was benign. She's been free of disease since that time.   She's had CA-125 was performed on a regular basis. Her most recent CA-125 June 27 was 10.1. She's also been undergoing annual CT scans of the abdomen and pelvis in November of every year. Her most recent CT scan 07/23/2015 revealed no evidence of peritoneal lesions or ascites. The patient was treated with 6 cycles of paclitaxel and carboplatin based chemotherapy which she completed from 03/27/2014 to 07/10/2014. She then underwent right pelvic radiation from 08/15/2014 to January 19 2016th. She has not been having routine pelvic exams and was somewhat shocked that she would have one today. She did have genetic testing and per her report she is BRCA negative. The patient does have a  personal history of breast cancer back in 2013. She underwent a right partial mastectomy which revealed invasive ductal carcinoma measuring 1.5 cm. The tumor was grade 3 with 2 negative sentinel lymph nodes. Tumor was ER positive PR negative HER-2 positive. Ki-67 marker was 20%. The patient declined chemotherapy and targeted therapy. She did undergo radiation to the right breast with 5040 cGy. She then took Arimidex for 5 years which she completed in October 2008.  Interval History:  I last saw her 11/17. Her CA-125 was 15.2 CT 07/18/16:  IMPRESSION: Stable exam. No evidence of recurrent or metastatic disease within the abdomen or pelvis.  She comes in today for follow-up. She is with her CO2 5 last time was 15 and was up from 10 prior was reassured about her imaging study. She is frustrated that she is unable to lose weight which is never been an issue for her before. She went to make sure that we knew that she was off of her Evista and is only taking Prilosec when necessary. There are no new medical problems and her family. Should a mammogram in the fall of 2017.  Review of Systems: Constitutional: Denies fevers, chills or headaches Skin: No rash Cardiovascular: No chest pain, shortness of breath, or edema  Pulmonary: No cough.  Gastro Intestinal: No nausea, vomiting, constipation, or diarrhea reported. No change in bowel movement. She denies early satiety or abdominal bloating. She feels "thick" through the abdomen.  Genitourinary: Denies vaginal bleeding and discharge.  Musculoskeletal: No joint swelling or pain.  Neurologic: She states  she has very poor balance and poor flexibility. Psychology: No changes   Current Meds:  Outpatient Encounter Prescriptions as of 11/05/2016  Medication Sig  . aspirin 81 MG tablet Take 81 mg by mouth daily.  Marland Kitchen azelastine (ASTELIN) 0.1 % nasal spray Place into the nose 3 times/day as needed-between meals & bedtime.   . EVISTA 60 MG tablet   . Multiple  Vitamin (MULTIVITAMIN) tablet Take 1 tablet by mouth daily.  Marland Kitchen omeprazole (PRILOSEC) 20 MG capsule daily as needed.   . polyethylene glycol (MIRALAX / GLYCOLAX) packet Take by mouth.  . simvastatin (ZOCOR) 10 MG tablet Take 10 mg by mouth at bedtime.  . [DISCONTINUED] Calcium Carbonate-Vitamin D (CALTRATE 600+D) 600-400 MG-UNIT per tablet Take 1 tablet by mouth daily.   No facility-administered encounter medications on file as of 11/05/2016.     Allergy:  Allergies  Allergen Reactions  . Penicillins     Felt like "pins and needles" on skin  . Tramadol Dermatitis    Itching , cough   . Keflex [Cephalexin] Rash    Social Hx:   Social History   Social History  . Marital status: Single    Spouse name: N/A  . Number of children: N/A  . Years of education: N/A   Occupational History  . Not on file.   Social History Main Topics  . Smoking status: Former Smoker    Quit date: 05/04/1978  . Smokeless tobacco: Never Used  . Alcohol use No  . Drug use: No  . Sexual activity: Not on file   Other Topics Concern  . Not on file   Social History Narrative  . No narrative on file    Past Surgical Hx:  Past Surgical History:  Procedure Laterality Date  . BREAST LUMPECTOMY     bilateral  . MASTECTOMY     [id number] right    Past Medical Hx:  Past Medical History:  Diagnosis Date  . BRCA negative   . Breast cancer ([place])   . GERD (gastroesophageal reflux disease)   . Hypercholesterolemia   . Osteoporosis     Oncology Hx:    Primary peritoneal carcinomatosis ([place])   01/10/2014 Initial Diagnosis    Primary peritoneal carcinomatosis ([place]), IIC      03/27/2014 - 07/10/2014 Chemotherapy    Status post 6 cycles of paclitaxel and carboplatin.       08/15/2014 - 09/26/2014 Radiation Therapy    5040 cGy in 28 fractions to the right pelvis.       Family Hx:  Family History  Problem Relation Age of Onset  . Colon cancer Father     Vitals:  Blood pressure 140/78, pulse  80, temperature 98.1 F (36.7 C), temperature source Oral, resp. rate 17, height 5' 5.5" (1.664 m), weight 129 lb 4.8 oz (58.7 kg), SpO2 100 %.  Physical Exam: Well-nourished well-developed female in no acute distress.  Neck: Supple, no no lymphadenopathy, no thyromegaly.  Lungs: Clear to auscultation bilaterally.  Cardiovascular: Regular rate and rhythm.  Abdomen: Well-healed vertical midline incision. Abdomen is soft, nontender, nondistended. There are no palpable masses or hepatosplenomegaly. No fluid wave, no appreciable hernia.  Groins: No lymphadenopathy.  Extremities no edema.  Pelvic: External genitalia within normal limits. Unidigital exam was performed. There are no masses or nodularity. The patient tolerated the exam very poorly. We could not perform a rectal secondary to patient tolerance and refusal.  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 She was not adequately staged. There was tumor on the posterior aspect of the uterus based on the operative note. This was consistent with clear cell carcinoma but the omentum was negative. There is no lymph node staging. She underwent 6 cycles of chemotherapy with paclitaxel and carboplatin followed by pelvic radiation therapy. She's been free of disease based primarily on CA-125's and CT scans.  We'll follow-up in results of her CA-125 from today. She'll return to see me in 3 months. It is her expectation that she would have a CT scan in November every year for 5 years as that is the plan that was outlined by her prior medical oncologist. We will order CT scan for November 2018.  She will follow-up with her other physicians as scheduled.  Her questions were elicited in answer to her satisfaction.  Nancy Marus A., MD 11/05/2016, 12:32 PM

## 2016-11-05 ENCOUNTER — Ambulatory Visit: Payer: Medicare Other | Attending: Gynecologic Oncology | Admitting: Gynecologic Oncology

## 2016-11-05 ENCOUNTER — Encounter: Payer: Self-pay | Admitting: Gynecologic Oncology

## 2016-11-05 ENCOUNTER — Other Ambulatory Visit (HOSPITAL_BASED_OUTPATIENT_CLINIC_OR_DEPARTMENT_OTHER): Payer: Medicare Other

## 2016-11-05 VITALS — BP 140/78 | HR 80 | Temp 98.1°F | Resp 17 | Ht 65.5 in | Wt 129.3 lb

## 2016-11-05 DIAGNOSIS — M810 Age-related osteoporosis without current pathological fracture: Secondary | ICD-10-CM | POA: Diagnosis not present

## 2016-11-05 DIAGNOSIS — Z853 Personal history of malignant neoplasm of breast: Secondary | ICD-10-CM | POA: Insufficient documentation

## 2016-11-05 DIAGNOSIS — Z7982 Long term (current) use of aspirin: Secondary | ICD-10-CM | POA: Insufficient documentation

## 2016-11-05 DIAGNOSIS — C482 Malignant neoplasm of peritoneum, unspecified: Secondary | ICD-10-CM

## 2016-11-05 DIAGNOSIS — Z87891 Personal history of nicotine dependence: Secondary | ICD-10-CM | POA: Diagnosis not present

## 2016-11-05 DIAGNOSIS — K219 Gastro-esophageal reflux disease without esophagitis: Secondary | ICD-10-CM | POA: Diagnosis not present

## 2016-11-05 DIAGNOSIS — Z8589 Personal history of malignant neoplasm of other organs and systems: Secondary | ICD-10-CM | POA: Diagnosis not present

## 2016-11-05 DIAGNOSIS — E7800 Pure hypercholesterolemia, unspecified: Secondary | ICD-10-CM | POA: Diagnosis not present

## 2016-11-05 NOTE — Patient Instructions (Signed)
We will notify you of the results of your bloodwork from today. Please return to see [place] in 3 months.

## 2016-11-06 LAB — CA 125: CANCER ANTIGEN (CA) 125: 11.4 U/mL (ref 0.0–38.1)

## 2016-11-07 ENCOUNTER — Telehealth: Payer: Self-pay

## 2016-11-07 NOTE — Telephone Encounter (Signed)
Told Andrea Simpson  that her CA-125 was WNL at 56.4.

## 2017-03-04 ENCOUNTER — Encounter: Payer: Self-pay | Admitting: Gynecologic Oncology

## 2017-03-04 ENCOUNTER — Ambulatory Visit: Payer: Medicare Other | Attending: Gynecologic Oncology | Admitting: Gynecologic Oncology

## 2017-03-04 ENCOUNTER — Other Ambulatory Visit: Payer: Self-pay | Admitting: Gynecologic Oncology

## 2017-03-04 ENCOUNTER — Other Ambulatory Visit: Payer: Medicare Other

## 2017-03-04 ENCOUNTER — Encounter: Payer: Self-pay | Admitting: Internal Medicine

## 2017-03-04 VITALS — BP 133/80 | HR 66 | Temp 97.9°F | Resp 18 | Wt 129.0 lb

## 2017-03-04 DIAGNOSIS — R1909 Other intra-abdominal and pelvic swelling, mass and lump: Secondary | ICD-10-CM

## 2017-03-04 DIAGNOSIS — M25551 Pain in right hip: Secondary | ICD-10-CM | POA: Insufficient documentation

## 2017-03-04 DIAGNOSIS — M810 Age-related osteoporosis without current pathological fracture: Secondary | ICD-10-CM | POA: Insufficient documentation

## 2017-03-04 DIAGNOSIS — Z853 Personal history of malignant neoplasm of breast: Secondary | ICD-10-CM | POA: Diagnosis not present

## 2017-03-04 DIAGNOSIS — C786 Secondary malignant neoplasm of retroperitoneum and peritoneum: Secondary | ICD-10-CM | POA: Diagnosis present

## 2017-03-04 DIAGNOSIS — Z923 Personal history of irradiation: Secondary | ICD-10-CM | POA: Diagnosis not present

## 2017-03-04 DIAGNOSIS — E7800 Pure hypercholesterolemia, unspecified: Secondary | ICD-10-CM | POA: Diagnosis not present

## 2017-03-04 DIAGNOSIS — N951 Menopausal and female climacteric states: Secondary | ICD-10-CM | POA: Insufficient documentation

## 2017-03-04 DIAGNOSIS — Z880 Allergy status to penicillin: Secondary | ICD-10-CM | POA: Insufficient documentation

## 2017-03-04 DIAGNOSIS — Z9221 Personal history of antineoplastic chemotherapy: Secondary | ICD-10-CM

## 2017-03-04 DIAGNOSIS — Z87891 Personal history of nicotine dependence: Secondary | ICD-10-CM | POA: Diagnosis not present

## 2017-03-04 DIAGNOSIS — K56600 Partial intestinal obstruction, unspecified as to cause: Secondary | ICD-10-CM | POA: Diagnosis not present

## 2017-03-04 DIAGNOSIS — K219 Gastro-esophageal reflux disease without esophagitis: Secondary | ICD-10-CM | POA: Insufficient documentation

## 2017-03-04 DIAGNOSIS — Z79899 Other long term (current) drug therapy: Secondary | ICD-10-CM | POA: Insufficient documentation

## 2017-03-04 DIAGNOSIS — Z1371 Encounter for nonprocreative screening for genetic disease carrier status: Secondary | ICD-10-CM

## 2017-03-04 DIAGNOSIS — Z7982 Long term (current) use of aspirin: Secondary | ICD-10-CM | POA: Insufficient documentation

## 2017-03-04 DIAGNOSIS — Z8589 Personal history of malignant neoplasm of other organs and systems: Secondary | ICD-10-CM

## 2017-03-04 DIAGNOSIS — C482 Malignant neoplasm of peritoneum, unspecified: Secondary | ICD-10-CM

## 2017-03-04 NOTE — Progress Notes (Signed)
Consult Note: Gyn-Onc  Andrea Simpson 71 y.o. female  CC:  Chief Complaint  Patient presents with  . Primary Peritoneal Carcenomatosis    HPI: Patient is seen today as transferred care at the request of Dr. Tressie Stalker. Primary physician: Andrea Simpson.  Patient is a 72 year old gravida 0 who was diagnosed with a pelvic mass and an elevated CA-125 (60s per patient report) in May 2015. On 01/10/2014 she underwent a TAH/BSO. Findings included what appeared to be bilateral normal ovaries with an inflammatory mass involving the back of the uterus, the right round ligament and towards the right pelvic sidewall. The mass was apparently sent for frozen section was felt to be benign. Final pathology however revealed an adenocarcinoma with implants and soft tissue posterior to the uterus. Per the pathology report, the adnexa and fallopian tubes were negative. The tissue posterior to the uterus was a clear cell carcinoma. The tumor was ER negative, CK 7 positive. This was felt to be most consistent with a clear cell carcinoma.   Per the patient's report, she then developed a partial small bowel obstruction and went back to the operating room on May 13. At that time omentum was excised that was benign. She's been free of disease since that time.   She's had CA-125 was performed on a regular basis. Her most recent CA-125 June 27 was 10.1. She's also been undergoing annual CT scans of the abdomen and pelvis in November of every year. Her most recent CT scan 07/23/2015 revealed no evidence of peritoneal lesions or ascites. The patient was treated with 6 cycles of paclitaxel and carboplatin based chemotherapy which she completed from 03/27/2014 to 07/10/2014. She then underwent right pelvic radiation from 08/15/2014 to January 19 2016th. She has not been having routine pelvic exams and was somewhat shocked that she would have one today. She did have genetic testing and per her report she is BRCA negative. The patient  does have a personal history of breast cancer back in 2013. She underwent a right partial mastectomy which revealed invasive ductal carcinoma measuring 1.5 cm. The tumor was grade 3 with 2 negative sentinel lymph nodes. Tumor was ER positive PR negative HER-2 positive. Ki-67 marker was 20%. The patient declined chemotherapy and targeted therapy. She did undergo radiation to the right breast with 5040 cGy. She then took Arimidex for 5 years which she completed in October 2008.  Interval History:  I last saw her 11/17. Her CA-125 was 15.2 CT 07/18/16:  IMPRESSION: Stable exam. No evidence of recurrent or metastatic disease within the abdomen or pelvis.  She comes in today for follow-up. Her CA-125 last time was 11.4 when I saw her 2/18. She's overall doing fairly well. She has a biopsy-proven basal cell on her forehead and she's currently scheduled to have that removed. She continues to have hot flashes about 2-3 times a day. She's not sure is really changed significantly from the past but she started think it might be more prominent. She will be due for mammogram in September and will also be due for colonoscopy. She has some every 5 months. She made a comment being concerned about the colonoscopy secondary to blockage she had. I discussed with her that she had a partial small bowel obstruction postoperatively and that it was not related to her colon and it therefore is not really an issue at the time of her colonoscopy. She was very relieved to hear this. There are no new medical problems and her family. She's  been having some issues with right hip pain that went down to her knee. She has been told that his bursitis.  Review of Systems: Constitutional: Denies fevers, chills or headaches Skin: No rash Cardiovascular: No chest pain, shortness of breath, or edema  Pulmonary: No cough.  Gastro Intestinal: No nausea, vomiting, constipation, or diarrhea reported. She denies early satiety or abdominal  bloating. She feels "thick" through the abdomen.  Genitourinary: Denies vaginal bleeding and discharge.  Musculoskeletal: + right hip pain Neurologic: "hopes that she does not have sciatica" Psychology: No changes   Current Meds:  Outpatient Encounter Prescriptions as of 03/04/2017  Medication Sig  . aspirin 81 MG tablet Take 81 mg by mouth daily.  Marland Kitchen azelastine (ASTELIN) 0.1 % nasal spray Place into the nose 2 (two) times daily as needed.   . Calcium Carbonate-Vitamin D (CALCIUM 600+D) 600-400 MG-UNIT tablet Take 1 tablet by mouth daily.  Marland Kitchen denosumab (PROLIA) 60 MG/ML SOLN injection Inject 60 mg into the skin every 6 (six) months. Administer in upper arm, thigh, or abdomen  . levothyroxine (SYNTHROID, LEVOTHROID) 25 MCG tablet Take 12.5 mcg by mouth daily.  . Multiple Vitamin (MULTIVITAMIN) tablet Take 1 tablet by mouth daily.  Marland Kitchen omeprazole (PRILOSEC) 20 MG capsule as needed.   . polyethylene glycol (MIRALAX / GLYCOLAX) packet Take by mouth.  . simvastatin (ZOCOR) 10 MG tablet Take 10 mg by mouth at bedtime.   No facility-administered encounter medications on file as of 03/04/2017.     Allergy:  Allergies  Allergen Reactions  . Penicillins     Felt like "pins and needles" on skin  . Tramadol Dermatitis    Itching , cough   . Keflex [Cephalexin] Rash    Social Hx:   Social History   Social History  . Marital status: Single    Spouse name: N/A  . Number of children: N/A  . Years of education: N/A   Occupational History  . Not on file.   Social History Main Topics  . Smoking status: Former Smoker    Quit date: 05/04/1978  . Smokeless tobacco: Never Used  . Alcohol use No  . Drug use: No  . Sexual activity: Not on file   Other Topics Concern  . Not on file   Social History Narrative  . No narrative on file    Past Surgical Hx:  Past Surgical History:  Procedure Laterality Date  . BREAST LUMPECTOMY     bilateral  . MASTECTOMY     [id number] right    Past  Medical Hx:  Past Medical History:  Diagnosis Date  . BRCA negative   . Breast cancer ([place])   . GERD (gastroesophageal reflux disease)   . Hypercholesterolemia   . Osteoporosis     Oncology Hx:    Primary peritoneal carcinomatosis ([place])   01/10/2014 Initial Diagnosis    Primary peritoneal carcinomatosis ([place]), [facility]      03/27/2014 - 07/10/2014 Chemotherapy    Status post 6 cycles of paclitaxel and carboplatin.       08/15/2014 - 09/26/2014 Radiation Therapy    5040 cGy in 28 fractions to the right pelvis.       Family Hx:  Family History  Problem Relation Age of Onset  . Colon cancer Father     Vitals:  Blood pressure 133/80, pulse 66, temperature 97.9 F (36.6 C), resp. rate 18, weight 129 lb (58.5 kg), SpO2 100 %.  Physical Exam: Well-nourished well-developed female in no acute distress.  Neck: Supple, no no lymphadenopathy, no thyromegaly.  Lungs: Clear to auscultation bilaterally.  Cardiovascular: Regular rate and rhythm.  Abdomen: Well-healed vertical midline incision. Abdomen is soft, nontender, nondistended. There are no palpable masses or hepatosplenomegaly. No fluid wave, no appreciable hernia.  Groins: No lymphadenopathy.  Extremities no edema.  Pelvic: External genitalia within normal limits. Unidigital exam was performed. There are no masses or nodularity. The patient tolerated the exam very poorly. We could not perform a rectal secondary to patient tolerance and refusal.  Assessment/Plan: 71 year old with a history of both breast cancer as well as a presumed stage IIc primary peritoneal carcinoma diagnosed in 5/15. She was not comprehensively surgically staged. There was tumor on the posterior aspect of the uterus based on the operative note. This was consistent with clear cell carcinoma but the omentum was negative. There was no lymph node staging. She underwent 6 cycles of chemotherapy with paclitaxel and carboplatin followed by pelvic radiation therapy.  She's been free of disease based primarily on CA-125's and CT scans.  We'll follow-up in results of her CA-125 from today. She'll return to see me in 3 months. It is her expectation that she would have a CT scan in November every year for 5 years as that is the plan that was outlined by her prior medical oncologist. We will order CT scan for November 2018.  She will follow-up with her other physicians as scheduled.  Her questions were elicited in answer to her satisfaction.  Eisha Chatterjee A., MD 03/04/2017, 11:21 AM

## 2017-03-04 NOTE — Patient Instructions (Signed)
We will notify you of your results from today. Please return to see [place] in 4 months. We will obtain a CT scan at that time.

## 2017-03-05 ENCOUNTER — Telehealth: Payer: Self-pay

## 2017-03-05 LAB — CA 125: Cancer Antigen (CA) 125: 13 U/mL (ref 0.0–38.1)

## 2017-03-05 NOTE — Telephone Encounter (Signed)
Told Andrea Simpson that her CA-125 was good at 13.0 per Melissa Cross,NP.

## 2017-03-13 ENCOUNTER — Encounter: Payer: Self-pay | Admitting: Internal Medicine

## 2017-04-15 ENCOUNTER — Encounter: Payer: Self-pay | Admitting: *Deleted

## 2017-05-21 ENCOUNTER — Ambulatory Visit (INDEPENDENT_AMBULATORY_CARE_PROVIDER_SITE_OTHER): Payer: Medicare Other | Admitting: Internal Medicine

## 2017-05-21 ENCOUNTER — Encounter: Payer: Self-pay | Admitting: Internal Medicine

## 2017-05-21 ENCOUNTER — Encounter (INDEPENDENT_AMBULATORY_CARE_PROVIDER_SITE_OTHER): Payer: Self-pay

## 2017-05-21 VITALS — BP 114/70 | HR 66 | Ht 66.75 in | Wt 134.0 lb

## 2017-05-21 DIAGNOSIS — K5909 Other constipation: Secondary | ICD-10-CM | POA: Diagnosis not present

## 2017-05-21 DIAGNOSIS — Z800 Family history of malignant neoplasm of digestive organs: Secondary | ICD-10-CM

## 2017-05-21 MED ORDER — SUPREP BOWEL PREP KIT 17.5-3.13-1.6 GM/177ML PO SOLN: 1.0000 | ORAL | 0 refills | Status: DC

## 2017-05-21 NOTE — Addendum Note (Signed)
Addended by: Larina Bras on: 05/21/2017 03:11 PM   Modules accepted: Orders

## 2017-05-21 NOTE — Progress Notes (Signed)
Patient ID: Andrea Simpson, female   DOB: April 08, 1946, 71 y.o.   MRN: [id number] HPI: Andrea Simpson is a 71 year old female with a family history of colon cancer who is seen to discuss screening colonoscopy. Her last colonoscopy was 5 years ago with Dr. Sharlett Simpson and normal. She has a pertinent medical history of peritoneal carcinoma/clear cell carcinoma status post hysterectomy and oophorectomy and status post chemotherapy and radiation (diagnosed 2015) felt to be in remission and being followed by serial imaging. More remote history of breast cancer around 2005.  She has been feeling well and denies GI complaint. She does have chronic constipation for which she uses MiraLAX 17 g twice a day. With this bowel movements are regular and occurring daily. She's had no change in bowel habit and no blood in her stool or melena. No weight loss. No upper GI complaint or hepatobiliary complaint.  Past Medical History:  Diagnosis Date  . BRCA negative   . Breast cancer ([place])   . GERD (gastroesophageal reflux disease)   . Hypercholesterolemia   . Osteoporosis   . Primary peritoneal carcinomatosis [facility])     Past Surgical History:  Procedure Laterality Date  . BREAST LUMPECTOMY     bilateral  . LAPAROSCOPIC HYSTERECTOMY    . MASTECTOMY     [id number] right    Outpatient Medications Prior to Visit  Medication Sig Dispense Refill  . aspirin 81 MG tablet Take 81 mg by mouth daily.    Marland Kitchen azelastine (ASTELIN) 0.1 % nasal spray Place into the nose 2 (two) times daily as needed.     . Calcium Carbonate-Vitamin D (CALCIUM 600+D) 600-400 MG-UNIT tablet Take 1 tablet by mouth daily.    Marland Kitchen denosumab (PROLIA) 60 MG/ML SOLN injection Inject 60 mg into the skin every 6 (six) months. Administer in upper arm, thigh, or abdomen    . levothyroxine (SYNTHROID, LEVOTHROID) 25 MCG tablet Take 12.5 mcg by mouth daily.  2  . Multiple Vitamin (MULTIVITAMIN) tablet Take 1 tablet by mouth daily.    Marland Kitchen omeprazole (PRILOSEC) 20 MG  capsule as needed.     . polyethylene glycol (MIRALAX / GLYCOLAX) packet Take by mouth.    . simvastatin (ZOCOR) 10 MG tablet Take 10 mg by mouth at bedtime.     No facility-administered medications prior to visit.     Allergies  Allergen Reactions  . Penicillins     Felt like "pins and needles" on skin  . Tramadol Dermatitis    Itching , cough   . Keflex [Cephalexin] Rash    Family History  Problem Relation Age of Onset  . Colon cancer Father   . Skin cancer Father     Social History  Substance Use Topics  . Smoking status: Former Smoker    Quit date: 05/04/1978  . Smokeless tobacco: Never Used  . Alcohol use No    ROS: As per history of present illness, otherwise negative  BP 114/70   Pulse 66   Ht 5' 6.75" (1.695 m)   Wt 134 lb (60.8 kg)   BMI 21.14 kg/m  Constitutional: Well-developed and well-nourished. No distress. HEENT: Normocephalic and atraumatic. Oropharynx is clear and moist. Conjunctivae are normal.  No scleral icterus. Neck: Neck supple. Trachea midline. Cardiovascular: Normal rate, regular rhythm and intact distal pulses. No M/R/G Pulmonary/chest: Effort normal and breath sounds normal. No wheezing, rales or rhonchi. Abdominal: Soft, nontender, nondistended. Bowel sounds active throughout. There are no masses palpable. No hepatosplenomegaly. Extremities: no clubbing, cyanosis, or  edema Neurological: Alert and oriented to person place and time. Skin: Skin is warm and dry.  Psychiatric: Normal mood and affect. Behavior is normal.  ASSESSMENT/PLAN: 71 year old female with a family history of colon cancer who is seen to discuss screening colonoscopy.  1. Family history of colon cancer -- she is due repeat screening colonoscopy at this time. She has history of malignancy but this is felt to be in remission. We discussed the risks, benefits and alternatives to colonoscopy and she is agreeable and wishes to proceed. I feel that this is an appropriate test  for her.  2. Chronic constipation -- she will continue MiraLAX 17 g twice a day  NK:NLZJ, Andrea Simpson, Andrea Simpson, Andrea Simpson

## 2017-05-21 NOTE — Patient Instructions (Signed)
You have been scheduled for a colonoscopy. Please follow written instructions given to you at your visit today.  Please pick up your prep supplies at the pharmacy within the next 1-3 days. If you use inhalers (even only as needed), please bring them with you on the day of your procedure. Your physician has requested that you go to www.startemmi.com and enter the access code given to you at your visit today. This web site gives a general overview about your procedure. However, you should still follow specific instructions given to you by our office regarding your preparation for the procedure.  If you are age 80 or older, your body mass index should be between 23-30. Your Body mass index is 21.14 kg/m. If this is out of the aforementioned range listed, please consider follow up with your Primary Care Provider.  If you are age 14 or younger, your body mass index should be between 19-25. Your Body mass index is 21.14 kg/m. If this is out of the aformentioned range listed, please consider follow up with your Primary Care Provider.

## 2017-05-29 ENCOUNTER — Telehealth: Payer: Self-pay | Admitting: Internal Medicine

## 2017-05-29 NOTE — Telephone Encounter (Signed)
Pt has recently had an episode of heart palpitations. Saw her primary care provider and was scheduled for a nuclear stress test, which was today. She will not have results back from test before colon on Monday. Advised her that we do need clearance from her provider before proceeding on with colonoscopy. She will call to reschedule appointment once she has documentation that she has clearance.

## 2017-06-01 ENCOUNTER — Encounter: Payer: Medicare Other | Admitting: Internal Medicine

## 2017-06-17 ENCOUNTER — Encounter: Payer: Self-pay | Admitting: Gynecologic Oncology

## 2017-06-23 ENCOUNTER — Other Ambulatory Visit (HOSPITAL_BASED_OUTPATIENT_CLINIC_OR_DEPARTMENT_OTHER): Payer: Medicare Other

## 2017-06-23 DIAGNOSIS — Z1371 Encounter for nonprocreative screening for genetic disease carrier status: Secondary | ICD-10-CM

## 2017-06-23 DIAGNOSIS — C482 Malignant neoplasm of peritoneum, unspecified: Secondary | ICD-10-CM | POA: Diagnosis not present

## 2017-06-23 LAB — COMPREHENSIVE METABOLIC PANEL
ALK PHOS: 45 U/L (ref 40–150)
ALT: 13 U/L (ref 0–55)
AST: 21 U/L (ref 5–34)
Albumin: 3.8 g/dL (ref 3.5–5.0)
Anion Gap: 10 mEq/L (ref 3–11)
BILIRUBIN TOTAL: 0.32 mg/dL (ref 0.20–1.20)
BUN: 22.5 mg/dL (ref 7.0–26.0)
CO2: 26 mEq/L (ref 22–29)
Calcium: 9.9 mg/dL (ref 8.4–10.4)
Chloride: 106 mEq/L (ref 98–109)
Creatinine: 1 mg/dL (ref 0.6–1.1)
EGFR: 56 mL/min/{1.73_m2} — ABNORMAL LOW (ref >60)
Glucose: 92 mg/dl (ref 70–140)
POTASSIUM: 4.4 meq/L (ref 3.5–5.1)
SODIUM: 141 meq/L (ref 136–145)
Total Protein: 6.8 g/dL (ref 6.4–8.3)

## 2017-06-24 LAB — CA 125: Cancer Antigen (CA) 125: 11.3 U/mL (ref 0.0–38.1)

## 2017-06-26 ENCOUNTER — Encounter (HOSPITAL_COMMUNITY): Payer: Self-pay

## 2017-06-26 ENCOUNTER — Ambulatory Visit (HOSPITAL_COMMUNITY)
Admission: RE | Admit: 2017-06-26 | Discharge: 2017-06-26 | Disposition: A | Discharge status: Home / Self Care | Payer: Medicare Other | Source: Ambulatory Visit | Attending: Gynecologic Oncology | Admitting: Gynecologic Oncology

## 2017-06-26 DIAGNOSIS — R1909 Other intra-abdominal and pelvic swelling, mass and lump: Secondary | ICD-10-CM | POA: Diagnosis not present

## 2017-06-26 DIAGNOSIS — C569 Malignant neoplasm of unspecified ovary: Secondary | ICD-10-CM | POA: Diagnosis present

## 2017-06-26 MED ORDER — IOPAMIDOL (ISOVUE-300) INJECTION 61%
INTRAVENOUS | Status: AC
  Filled 2017-06-26: qty 100

## 2017-06-26 MED ORDER — IOPAMIDOL (ISOVUE-300) INJECTION 61%
100.0000 mL | Freq: Once | INTRAVENOUS | Status: AC | PRN
  Administered 2017-06-26: 100 mL via INTRAVENOUS

## 2017-07-01 ENCOUNTER — Other Ambulatory Visit: Payer: Self-pay | Admitting: Gynecologic Oncology

## 2017-07-01 ENCOUNTER — Other Ambulatory Visit (HOSPITAL_BASED_OUTPATIENT_CLINIC_OR_DEPARTMENT_OTHER): Payer: Medicare Other

## 2017-07-01 ENCOUNTER — Encounter: Payer: Self-pay | Admitting: Gynecologic Oncology

## 2017-07-01 ENCOUNTER — Ambulatory Visit: Payer: Medicare Other | Attending: Gynecologic Oncology | Admitting: Gynecologic Oncology

## 2017-07-01 VITALS — BP 138/78 | HR 77 | Temp 98.3°F | Resp 20 | Ht 66.5 in | Wt 130.6 lb

## 2017-07-01 DIAGNOSIS — Z1501 Genetic susceptibility to malignant neoplasm of breast: Secondary | ICD-10-CM | POA: Diagnosis not present

## 2017-07-01 DIAGNOSIS — Z9889 Other specified postprocedural states: Secondary | ICD-10-CM | POA: Insufficient documentation

## 2017-07-01 DIAGNOSIS — Z800 Family history of malignant neoplasm of digestive organs: Secondary | ICD-10-CM | POA: Diagnosis not present

## 2017-07-01 DIAGNOSIS — Z7982 Long term (current) use of aspirin: Secondary | ICD-10-CM | POA: Diagnosis not present

## 2017-07-01 DIAGNOSIS — C569 Malignant neoplasm of unspecified ovary: Secondary | ICD-10-CM

## 2017-07-01 DIAGNOSIS — Z853 Personal history of malignant neoplasm of breast: Secondary | ICD-10-CM

## 2017-07-01 DIAGNOSIS — Z170 Estrogen receptor positive status [ER+]: Secondary | ICD-10-CM | POA: Diagnosis not present

## 2017-07-01 DIAGNOSIS — C482 Malignant neoplasm of peritoneum, unspecified: Secondary | ICD-10-CM | POA: Diagnosis not present

## 2017-07-01 DIAGNOSIS — Z8589 Personal history of malignant neoplasm of other organs and systems: Secondary | ICD-10-CM | POA: Diagnosis not present

## 2017-07-01 DIAGNOSIS — Z79899 Other long term (current) drug therapy: Secondary | ICD-10-CM | POA: Diagnosis not present

## 2017-07-01 DIAGNOSIS — Z9011 Acquired absence of right breast and nipple: Secondary | ICD-10-CM | POA: Diagnosis not present

## 2017-07-01 DIAGNOSIS — Z808 Family history of malignant neoplasm of other organs or systems: Secondary | ICD-10-CM | POA: Insufficient documentation

## 2017-07-01 DIAGNOSIS — K219 Gastro-esophageal reflux disease without esophagitis: Secondary | ICD-10-CM | POA: Insufficient documentation

## 2017-07-01 DIAGNOSIS — E7800 Pure hypercholesterolemia, unspecified: Secondary | ICD-10-CM | POA: Diagnosis not present

## 2017-07-01 DIAGNOSIS — Z87891 Personal history of nicotine dependence: Secondary | ICD-10-CM | POA: Insufficient documentation

## 2017-07-01 DIAGNOSIS — Z880 Allergy status to penicillin: Secondary | ICD-10-CM | POA: Insufficient documentation

## 2017-07-01 DIAGNOSIS — Z923 Personal history of irradiation: Secondary | ICD-10-CM

## 2017-07-01 DIAGNOSIS — Z881 Allergy status to other antibiotic agents status: Secondary | ICD-10-CM | POA: Insufficient documentation

## 2017-07-01 DIAGNOSIS — Z885 Allergy status to narcotic agent status: Secondary | ICD-10-CM | POA: Diagnosis not present

## 2017-07-01 DIAGNOSIS — Z9221 Personal history of antineoplastic chemotherapy: Secondary | ICD-10-CM | POA: Insufficient documentation

## 2017-07-01 NOTE — Progress Notes (Signed)
Consult Note: Gyn-Onc  Andrea Simpson 71 y.o. female  CC:  Chief Complaint  Patient presents with  . Primary peritoneal carcinomatosis ([facility]CC)    HPI: Patient is seen today as transferred care at the request of Dr. Tressie Stalker. Primary physician: [name].  Patient is a 71 year old gravida 0 who was diagnosed with a pelvic mass and an elevated CA-125 (60s per patient report) in May 2015. On 01/10/2014 she underwent a TAH/BSO. Findings included what appeared to be bilateral normal ovaries with an inflammatory mass involving the back of the uterus, the right round ligament and towards the right pelvic sidewall. The mass was apparently sent for frozen section was felt to be benign. Final pathology however revealed an adenocarcinoma with implants and soft tissue posterior to the uterus. Per the pathology report, the adnexa and fallopian tubes were negative. The tissue posterior to the uterus was a clear cell carcinoma. The tumor was ER negative, CK 7 positive. This was felt to be most consistent with a clear cell carcinoma.   Per the patient's report, she then developed a partial small bowel obstruction and went back to the operating room on May 13. At that time omentum was excised that was benign. She's been free of disease since that time.   She's had CA-125 was performed on a regular basis. Her most recent CA-125 June 27 was 10.1. She's also been undergoing annual CT scans of the abdomen and pelvis in November of every year. Her most recent CT scan 07/23/2015 revealed no evidence of peritoneal lesions or ascites. The patient was treated with 6 cycles of paclitaxel and carboplatin based chemotherapy which she completed from 03/27/2014 to 07/10/2014. She then underwent right pelvic radiation from 08/15/2014 to January 19 2016th. She has not been having routine pelvic exams and was somewhat shocked that she would have one today. She did have genetic testing and per her report she is BRCA negative. The  patient does have a personal history of breast cancer back in 2013. She underwent a right partial mastectomy which revealed invasive ductal carcinoma measuring 1.5 cm. The tumor was grade 3 with 2 negative sentinel lymph nodes. Tumor was ER positive PR negative HER-2 positive. Ki-67 marker was 20%. The patient declined chemotherapy and targeted therapy. She did undergo radiation to the right breast with 5040 cGy. She then took Arimidex for 5 years which she completed in October 2008.  Interval History:  I last saw her 6/18. Her CA-125 was 13 CT 06/26/17: IMPRESSION: Stable exam. No evidence of recurrent or metastatic disease in the abdomen or pelvis.  She comes in today for follow-up. She states that about a month ago she had an episode where she felt her heart pounding in her throat she went to the emergency room. Chest x-ray EKG and labs were all unremarkable. She saw her primary care physician ordered a stress test that similarly was negative. Her TSH was a little bit elevated so they just her Synthroid dose. She's not had an episode since that time. She is up-to-date on her mammograms. She is due now for colonoscopy. She has been seen by gastroenterologist discussed her concerns about having scar tissue around her: Severe going to perform her colonoscopy with a pediatric scope. She does complain of leg cramps. She admits to not drinking much water.  Review of Systems: Constitutional: Denies fevers, chills or headaches Skin: No rash Cardiovascular: No chest pain, shortness of breath, or edema  Pulmonary: No cough.  Gastro Intestinal: No nausea, vomiting,  constipation, or diarrhea reported. She denies early satiety or abdominal bloating. She feels "thick" through the abdomen.  Genitourinary: Denies vaginal bleeding and discharge.  Musculoskeletal: + leg cramps at night Psychology: No changes   Current Meds:  Outpatient Encounter Prescriptions as of 07/01/2017  Medication Sig  . aspirin 81 MG  tablet Take 81 mg by mouth daily.  Marland Kitchen azelastine (ASTELIN) 0.1 % nasal spray Place into the nose 2 (two) times daily as needed.   . Calcium Carbonate-Vitamin D (CALCIUM 600+D) 600-400 MG-UNIT tablet Take 1 tablet by mouth daily.  Marland Kitchen denosumab (PROLIA) 60 MG/ML SOLN injection Inject 60 mg into the skin every 6 (six) months. Administer in upper arm, thigh, or abdomen  . levothyroxine (SYNTHROID, LEVOTHROID) 25 MCG tablet Take 25 mcg by mouth daily.   . Multiple Vitamin (MULTIVITAMIN) tablet Take 1 tablet by mouth daily.  Marland Kitchen omeprazole (PRILOSEC) 20 MG capsule as needed.   . polyethylene glycol (MIRALAX / GLYCOLAX) packet Take by mouth.  . simvastatin (ZOCOR) 10 MG tablet Take 10 mg by mouth at bedtime.  Manus Gunning BOWEL PREP KIT 17.5-3.13-1.6 GM/180ML SOLN Take 1 kit by mouth as directed.   No facility-administered encounter medications on file as of 07/01/2017.     Allergy:  Allergies  Allergen Reactions  . Penicillins     Felt like "pins and needles" on skin  . Tramadol Dermatitis    Itching , cough   . Keflex [Cephalexin] Rash    Social Hx:   Social History   Social History  . Marital status: Single    Spouse name: N/A  . Number of children: N/A  . Years of education: N/A   Occupational History  . Not on file.   Social History Main Topics  . Smoking status: Former Smoker    Quit date: 05/04/1978  . Smokeless tobacco: Never Used  . Alcohol use No  . Drug use: No  . Sexual activity: Not on file   Other Topics Concern  . Not on file   Social History Narrative  . No narrative on file    Past Surgical Hx:  Past Surgical History:  Procedure Laterality Date  . BREAST LUMPECTOMY     bilateral  . LAPAROSCOPIC HYSTERECTOMY    . MASTECTOMY     [id number] right    Past Medical Hx:  Past Medical History:  Diagnosis Date  . BRCA negative   . Breast cancer ([place])   . GERD (gastroesophageal reflux disease)   . Hypercholesterolemia   . Osteoporosis   . Primary peritoneal  carcinomatosis [facility])     Oncology Hx:    Primary peritoneal carcinomatosis ([place])   01/10/2014 Initial Diagnosis    Primary peritoneal carcinomatosis ([place]), IIC      03/27/2014 - 07/10/2014 Chemotherapy    Status post 6 cycles of paclitaxel and carboplatin.       08/15/2014 - 09/26/2014 Radiation Therapy    5040 cGy in 28 fractions to the right pelvis.       Family Hx:  Family History  Problem Relation Age of Onset  . Colon cancer Father   . Skin cancer Father     Vitals:  Blood pressure 138/78, pulse 77, temperature 98.3 F (36.8 C), temperature source Oral, resp. rate 20, height 5' 6.5" (1.689 m), weight 130 lb 9.6 oz (59.2 kg), SpO2 100 %.  Physical Exam: Well-nourished well-developed female in no acute distress.  Neck: Supple, no no lymphadenopathy, no thyromegaly.  Lungs: Clear to auscultation bilaterally.  Cardiovascular: Regular rate and rhythm.  Abdomen: Well-healed vertical midline incision. Abdomen is soft, nontender, nondistended. There are no palpable masses or hepatosplenomegaly. No fluid wave, no appreciable hernia.  Groins: No lymphadenopathy.  Extremities no edema.  Pelvic: External genitalia within normal limits. Unidigital exam was performed. There are no masses or nodularity. The patient tolerated the exam very poorly. We could not perform a rectal secondary to patient refusal.  Assessment/Plan: 71 year old with a history of both breast cancer as well as a presumed stage IIc primary peritoneal carcinoma diagnosed in 5/15. She was not comprehensively surgically staged. There was tumor on the posterior aspect of the uterus based on the operative note. This was consistent with clear cell carcinoma but the omentum was negative. There was no lymph node staging. She underwent 6 cycles of chemotherapy with paclitaxel and carboplatin followed by pelvic radiation therapy. She's been free of disease based primarily on CA-125's and CT scans.  We'll follow-up in  results of her CA-125 from today. She'll return to see me in 6 months. It is her expectation that she would have a CT scan in November every year for 5 years as that is the plan that was outlined by her prior medical oncologist. We will order CT scan for November 2019.  She will follow-up with her other physicians as scheduled.  Her questions were elicited in answer to her satisfaction.  Lakaisha Danish A., MD 07/01/2017, 11:34 AM

## 2017-07-01 NOTE — Patient Instructions (Signed)
We will notify you of the results of your bloodwork from today. Please return to see [place] in 6 months.

## 2017-07-02 LAB — CA 125: CANCER ANTIGEN (CA) 125: 12 U/mL (ref 0.0–38.1)

## 2017-07-06 ENCOUNTER — Telehealth: Payer: Self-pay | Admitting: Gynecologic Oncology

## 2017-07-06 NOTE — Telephone Encounter (Signed)
Patient informed of CA 125 results.  No concerns voiced. 

## 2017-08-10 ENCOUNTER — Encounter: Payer: Self-pay | Admitting: Internal Medicine

## 2017-08-10 ENCOUNTER — Ambulatory Visit (AMBULATORY_SURGERY_CENTER): Payer: Medicare Other | Admitting: Internal Medicine

## 2017-08-10 VITALS — BP 140/66 | HR 69 | Temp 97.8°F | Resp 11 | Ht 66.75 in | Wt 134.0 lb

## 2017-08-10 DIAGNOSIS — D124 Benign neoplasm of descending colon: Secondary | ICD-10-CM

## 2017-08-10 DIAGNOSIS — D125 Benign neoplasm of sigmoid colon: Secondary | ICD-10-CM | POA: Diagnosis not present

## 2017-08-10 DIAGNOSIS — Z800 Family history of malignant neoplasm of digestive organs: Secondary | ICD-10-CM

## 2017-08-10 DIAGNOSIS — Z1211 Encounter for screening for malignant neoplasm of colon: Secondary | ICD-10-CM | POA: Diagnosis not present

## 2017-08-10 MED ORDER — SODIUM CHLORIDE 0.9 % IV SOLN: 500.0000 mL | INTRAVENOUS | Status: DC

## 2017-08-10 NOTE — Op Note (Signed)
[facility] Patient Name: Andrea Simpson Procedure Date: 08/10/2017 2:22 PM MRN: [id number] Endoscopist: Jerene Bears , MD Age: 71 Referring MD:  Date of Birth: 09/13/1945 Gender: Female Account #: [id number] Procedure:                Colonoscopy Indications:              Screening in patient at increased risk: Family                            history of 1st-degree relative with colorectal                            cancer, Last colonoscopy 5 years ago Medicines:                Monitored Anesthesia Care Procedure:                Pre-Anesthesia Assessment:                           - Prior to the procedure, a History and Physical                            was performed, and patient medications and                            allergies were reviewed. The patient's tolerance of                            previous anesthesia was also reviewed. The risks                            and benefits of the procedure and the sedation                            options and risks were discussed with the patient.                            All questions were answered, and informed consent                            was obtained. Prior Anticoagulants: The patient has                            taken no previous anticoagulant or antiplatelet                            agents. ASA Grade Assessment: II - A patient with                            mild systemic disease. After reviewing the risks                            and benefits, the patient was deemed in  satisfactory condition to undergo the procedure.                           After obtaining informed consent, the colonoscope                            was passed under direct vision. Throughout the                            procedure, the patient's blood pressure, pulse, and                            oxygen saturations were monitored continuously. The                            Colonoscope was introduced  through the anus and                            advanced to the the cecum, identified by                            appendiceal orifice and ileocecal valve. The                            colonoscopy was performed without difficulty using                            water immersion technique in the left colon to                            navigate the rectosigmoid and sigmoid colon. The                            patient tolerated the procedure well. The quality                            of the bowel preparation was good. The ileocecal                            valve, appendiceal orifice, and rectum were                            photographed. Scope In: 2:25:11 PM Scope Out: 2:43:04 PM Scope Withdrawal Time: 0 hours 11 minutes 0 seconds  Total Procedure Duration: 0 hours 17 minutes 53 seconds  Findings:                 The digital rectal exam was normal.                           A 5 mm polyp was found in the descending colon. The                            polyp was sessile. The polyp was removed with a  cold snare. Resection and retrieval were complete.                           A 1 mm polyp was found in the sigmoid colon. The                            polyp was sessile. The polyp was removed with a                            cold biopsy forceps. Resection and retrieval were                            complete.                           Anal papilla(e) were hypertrophied. Complications:            No immediate complications. Estimated Blood Loss:     Estimated blood loss was minimal. Impression:               - One 5 mm polyp in the descending colon, removed                            with a cold snare. Resected and retrieved.                           - One 1 mm polyp in the sigmoid colon, removed with                            a cold biopsy forceps. Resected and retrieved.                           - Anal papilla(e) were hypertrophied and seen on                             retroflexion in the rectum. Retroflexed view was                            otherwise normal. Recommendation:           - Patient has a contact number available for                            emergencies. The signs and symptoms of potential                            delayed complications were discussed with the                            patient. Return to normal activities tomorrow.                            Written discharge instructions were provided to the  patient.                           - Resume previous diet.                           - Continue present medications.                           - Await pathology results.                           - Repeat colonoscopy is recommended. The                            colonoscopy date will be determined after pathology                            results from today's exam become available for                            review. Jerene Bears, MD 08/10/2017 2:47:46 PM This report has been signed electronically.

## 2017-08-10 NOTE — Patient Instructions (Signed)
YOU HAD AN ENDOSCOPIC PROCEDURE TODAY AT [facility]:   Refer to the procedure report that was given to you for any specific questions about what was found during the examination.  If the procedure report does not answer your questions, please call your gastroenterologist to clarify.  If you requested that your care partner not be given the details of your procedure findings, then the procedure report has been included in a sealed envelope for you to review at your convenience later.  YOU SHOULD EXPECT: Some feelings of bloating in the abdomen. Passage of more gas than usual.  Walking can help get rid of the air that was put into your GI tract during the procedure and reduce the bloating. If you had a lower endoscopy (such as a colonoscopy or flexible sigmoidoscopy) you may notice spotting of blood in your stool or on the toilet paper. If you underwent a bowel prep for your procedure, you may not have a normal bowel movement for a few days.  Please Note:  You might notice some irritation and congestion in your nose or some drainage.  This is from the oxygen used during your procedure.  There is no need for concern and it should clear up in a day or so.  SYMPTOMS TO REPORT IMMEDIATELY:   Following lower endoscopy (colonoscopy or flexible sigmoidoscopy):  Excessive amounts of blood in the stool  Significant tenderness or worsening of abdominal pains  Swelling of the abdomen that is new, acute  Fever of 100F or higher  For urgent or emergent issues, a gastroenterologist can be reached at any hour by calling [phone].   DIET:  We do recommend a small meal at first, but then you may proceed to your regular diet.  Drink plenty of fluids but you should avoid alcoholic beverages for 24 hours.  ACTIVITY:  You should plan to take it easy for the rest of today and you should NOT DRIVE or use heavy machinery until tomorrow (because of the sedation medicines used during the test).     FOLLOW UP: Our staff will call the number listed on your records the next business day following your procedure to check on you and address any questions or concerns that you may have regarding the information given to you following your procedure. If we do not reach you, we will leave a message.  However, if you are feeling well and you are not experiencing any problems, there is no need to return our call.  We will assume that you have returned to your regular daily activities without incident.  If any biopsies were taken you will be contacted by phone or by letter within the next 1-3 weeks.  Please call us at [phone] if you have not heard about the biopsies in 3 weeks.   Await for biopsy results to determine next repeat Colonoscopy Polyps (handout given)  SIGNATURES/CONFIDENTIALITY: You and/or your care partner have signed paperwork which will be entered into your electronic medical record.  These signatures attest to the fact that that the information above on your After Visit Summary has been reviewed and is understood.  Full responsibility of the confidentiality of this discharge information lies with you and/or your care-partner.

## 2017-08-10 NOTE — Progress Notes (Signed)
Report to PACU, RN, vss, BBS= Clear.  

## 2017-08-10 NOTE — Progress Notes (Signed)
Called to room to assist during endoscopic procedure.  Patient ID and intended procedure confirmed with present staff. Received instructions for my participation in the procedure from the performing physician.  

## 2017-08-11 ENCOUNTER — Telehealth: Payer: Self-pay | Admitting: *Deleted

## 2017-08-11 NOTE — Telephone Encounter (Signed)
  Follow up Call-  Call back number 08/10/2017  Post procedure Call Back phone  # [phone] cell  Permission to leave phone message Yes  Some recent data might be hidden     Patient questions:  Do you have a fever, pain , or abdominal swelling? No. Pain Score  0 *  Have you tolerated food without any problems? Yes.    Have you been able to return to your normal activities? Yes.    Do you have any questions about your discharge instructions: Diet   No. Medications  No. Follow up visit  No.  Do you have questions or concerns about your Care? No.  Actions: * If pain score is 4 or above: No action needed, pain <4.

## 2017-08-13 ENCOUNTER — Encounter: Payer: Self-pay | Admitting: Internal Medicine

## 2017-09-17 ENCOUNTER — Telehealth: Payer: Self-pay | Admitting: *Deleted

## 2017-09-17 NOTE — Telephone Encounter (Signed)
Returned the patient's call and informed the patient to call back on January 18th to make an appt.

## 2017-09-25 ENCOUNTER — Telehealth: Payer: Self-pay | Admitting: *Deleted

## 2017-09-25 NOTE — Telephone Encounter (Signed)
Patient called and was schedule for April 19th. Patient aware that Dr. Alycia Rossetti will no longer be here and Dr. Denman George will see her.

## 2017-12-02 ENCOUNTER — Telehealth: Payer: Self-pay | Admitting: *Deleted

## 2017-12-02 NOTE — Telephone Encounter (Signed)
Left a message for the patient to call the office back. Need to move appt from April 19th

## 2017-12-03 ENCOUNTER — Telehealth: Payer: Self-pay | Admitting: *Deleted

## 2017-12-03 NOTE — Telephone Encounter (Signed)
Patient called back and appt moved from April 19th to April 24th

## 2017-12-25 ENCOUNTER — Ambulatory Visit: Payer: Medicare Other | Admitting: Gynecologic Oncology

## 2017-12-30 ENCOUNTER — Encounter: Payer: Self-pay | Admitting: Obstetrics

## 2017-12-30 ENCOUNTER — Inpatient Hospital Stay: Payer: Medicare Other | Attending: Gynecologic Oncology | Admitting: Obstetrics

## 2017-12-30 ENCOUNTER — Inpatient Hospital Stay: Payer: Medicare Other

## 2017-12-30 VITALS — BP 135/76 | HR 72 | Temp 98.7°F | Resp 18 | Ht 66.5 in | Wt 130.0 lb

## 2017-12-30 DIAGNOSIS — Z9221 Personal history of antineoplastic chemotherapy: Secondary | ICD-10-CM

## 2017-12-30 DIAGNOSIS — C569 Malignant neoplasm of unspecified ovary: Secondary | ICD-10-CM

## 2017-12-30 DIAGNOSIS — Z7982 Long term (current) use of aspirin: Secondary | ICD-10-CM | POA: Diagnosis not present

## 2017-12-30 DIAGNOSIS — E7800 Pure hypercholesterolemia, unspecified: Secondary | ICD-10-CM | POA: Diagnosis not present

## 2017-12-30 DIAGNOSIS — Z800 Family history of malignant neoplasm of digestive organs: Secondary | ICD-10-CM | POA: Insufficient documentation

## 2017-12-30 DIAGNOSIS — C801 Malignant (primary) neoplasm, unspecified: Secondary | ICD-10-CM

## 2017-12-30 DIAGNOSIS — Z9223 Personal history of estrogen therapy: Secondary | ICD-10-CM | POA: Insufficient documentation

## 2017-12-30 DIAGNOSIS — Z853 Personal history of malignant neoplasm of breast: Secondary | ICD-10-CM | POA: Diagnosis not present

## 2017-12-30 DIAGNOSIS — Z79899 Other long term (current) drug therapy: Secondary | ICD-10-CM | POA: Insufficient documentation

## 2017-12-30 DIAGNOSIS — Z923 Personal history of irradiation: Secondary | ICD-10-CM | POA: Diagnosis not present

## 2017-12-30 DIAGNOSIS — K219 Gastro-esophageal reflux disease without esophagitis: Secondary | ICD-10-CM | POA: Diagnosis not present

## 2017-12-30 DIAGNOSIS — E079 Disorder of thyroid, unspecified: Secondary | ICD-10-CM | POA: Diagnosis not present

## 2017-12-30 DIAGNOSIS — C482 Malignant neoplasm of peritoneum, unspecified: Secondary | ICD-10-CM | POA: Insufficient documentation

## 2017-12-30 DIAGNOSIS — Z880 Allergy status to penicillin: Secondary | ICD-10-CM | POA: Insufficient documentation

## 2017-12-30 DIAGNOSIS — Z170 Estrogen receptor positive status [ER+]: Secondary | ICD-10-CM | POA: Diagnosis not present

## 2017-12-30 DIAGNOSIS — M810 Age-related osteoporosis without current pathological fracture: Secondary | ICD-10-CM | POA: Insufficient documentation

## 2017-12-30 DIAGNOSIS — Z87891 Personal history of nicotine dependence: Secondary | ICD-10-CM | POA: Diagnosis not present

## 2017-12-30 NOTE — Patient Instructions (Signed)
1. Plan for return in 6 months 2. CT Scan to be set up ~ 1 week prior 3. CA125 will be arranged prior to return also

## 2017-12-31 ENCOUNTER — Telehealth: Payer: Self-pay | Admitting: *Deleted

## 2017-12-31 LAB — CA 125: Cancer Antigen (CA) 125: 11.3 U/mL (ref 0.0–38.1)

## 2017-12-31 NOTE — Telephone Encounter (Signed)
Called patient to tell her that her CA-125 was 11.3, within normal limits, per Joylene John, NP.  Patient verbalized understanding.

## 2018-01-20 ENCOUNTER — Encounter: Payer: Self-pay | Admitting: Obstetrics

## 2018-01-20 NOTE — Progress Notes (Signed)
Consult Note: Gyn-Onc  CC:  Chief Complaint  Patient presents with  . Primary Peritoneal Cancer    HPI: Patient is a 72 y.o. gravida 0   Interval History:  Patient presents as a new patient to me.  She had previously been followed by Dr. Alycia Rossetti who has now focused her practice in [place].  She was last seen by Dr. Alycia Rossetti 07/01/2017.  At that time she was felt to be free of disease.  A Ca1 25 was obtained and plan was for follow-up in 6 months.  In addition CT scan is planned for November 2019.  Upon meeting the patient today she has no significant complaints.  She does note some weight gain but denies bloating pain and urinary frequency.   Presenting History: She was diagnosed with a pelvic mass and an elevated CA-125 (60s per patient report) in May 2015. On 01/10/2014 she underwent a TAH/BSO with her primary Gyn. Findings included what appeared to be bilateral normal ovaries with an "inflammatory mass" involving the back of the uterus, the right round ligament and towards the right pelvic sidewall. The mass was apparently sent for frozen section was felt to be benign. Final pathology however revealed an adenocarcinoma with implants and soft tissue posterior to the uterus. Per the pathology report, the adnexa and fallopian tubes were negative. The tissue posterior to the uterus was a clear cell carcinoma. The tumor was ER negative, CK 7 positive. This was felt to be most consistent with a clear cell carcinoma.   Per the patient's report, she then developed a partial small bowel obstruction and went back to the operating room on May 13. At that time omentum was excised that was benign. The patient was treated (in [place] with a [employer]) with 6 cycles of paclitaxel and carboplatin based chemotherapy which she completed from 03/27/2014 to 07/10/2014. She then underwent right pelvic radiation (with Dr. Sondra Come) from 08/15/2014 to 09/26/2014.  She's been free of disease since that  time.   She's had CA-125 performed on a regular basis. CA-125 "June 27" was 10.1. She's also been undergoing annual CT scans of the abdomen and pelvis in November of every year. She had not been having routine pelvic exams. She did have genetic testing and per her report she is "BRCA negative". The patient does have a personal history of breast cancer back in 2013. She underwent a right partial mastectomy which revealed invasive ductal carcinoma measuring 1.5 cm. The tumor was grade 3 with 2 negative sentinel lymph nodes. Tumor was ER positive PR negative HER-2 positive. Ki-67 marker was 20%. The patient declined chemotherapy and targeted therapy. She did undergo radiation to the right breast with 5040 cGy. She then took Arimidex for 5 years which she completed in October 2008.  Measurement of disease: CA125   preop reported to be in the "60's"  02/23/17 - 13  Radiology:  CT11/14/2016 revealed no evidence of peritoneal lesions or ascites  CT 06/26/17: IMPRESSION: Stable exam. No evidence of recurrent or metastatic disease in the abdomen or pelvis.  Current Meds:  Outpatient Encounter Medications as of 12/30/2017  Medication Sig  . aspirin 81 MG tablet Take 81 mg by mouth daily.   Marland Kitchen azelastine (ASTELIN) 0.1 % nasal spray Place into the nose 2 (two) times daily as needed.   . Calcium Carbonate-Vitamin D (CALCIUM 600+D) 600-400 MG-UNIT tablet Take 1 tablet by mouth daily.  Marland Kitchen denosumab (PROLIA) 60 MG/ML SOLN injection Inject 60 mg into the skin  every 6 (six) months. Administer in upper arm, thigh, or abdomen  . levothyroxine (SYNTHROID, LEVOTHROID) 25 MCG tablet Take 25 mcg by mouth daily.   . Multiple Vitamin (MULTIVITAMIN) tablet Take 1 tablet by mouth daily.  Marland Kitchen omeprazole (PRILOSEC) 20 MG capsule as needed.   . polyethylene glycol (MIRALAX / GLYCOLAX) packet Take by mouth.  . simvastatin (ZOCOR) 10 MG tablet Take 10 mg by mouth at bedtime.   No facility-administered encounter medications  on file as of 12/30/2017.     Allergy:  Allergies  Allergen Reactions  . Penicillins     Felt like "pins and needles" on skin  . Tramadol Dermatitis    Itching , cough   . Keflex [Cephalexin] Rash    Social Hx:   Social History   Socioeconomic History  . Marital status: Single    Spouse name: Not on file  . Number of children: Not on file  . Years of education: Not on file  . Highest education level: Not on file  Occupational History  . Not on file  Social Needs  . Financial resource strain: Not on file  . Food insecurity:    Worry: Not on file    Inability: Not on file  . Transportation needs:    Medical: Not on file    Non-medical: Not on file  Tobacco Use  . Smoking status: Former Smoker    Last attempt to quit: 05/04/1978    Years since quitting: 39.7  . Smokeless tobacco: Never Used  Substance and Sexual Activity  . Alcohol use: No    Alcohol/week: 0.0 oz  . Drug use: No  . Sexual activity: Not on file  Lifestyle  . Physical activity:    Days per week: Not on file    Minutes per session: Not on file  . Stress: Not on file  Relationships  . Social connections:    Talks on phone: Not on file    Gets together: Not on file    Attends religious service: Not on file    Active member of club or organization: Not on file    Attends meetings of clubs or organizations: Not on file    Relationship status: Not on file  . Intimate partner violence:    Fear of current or ex partner: Not on file    Emotionally abused: Not on file    Physically abused: Not on file    Forced sexual activity: Not on file  Other Topics Concern  . Not on file  Social History Narrative  . Not on file    Past Surgical Hx:  Past Surgical History:  Procedure Laterality Date  . abdominal laparoscopic surgery     bowel obstruction  . BREAST LUMPECTOMY     bilateral  . COLONOSCOPY    . MASTECTOMY     [id number] right  . portcath     . TOTAL ABDOMINAL HYSTERECTOMY W/ BILATERAL  SALPINGOOPHORECTOMY  2015  . UPPER GASTROINTESTINAL ENDOSCOPY    . wisdom teth extraction      Past Medical Hx:  Past Medical History:  Diagnosis Date  . Bowel obstruction ([place])    2015  . BRCA negative   . Breast cancer ([place])   . Cataract   . GERD (gastroesophageal reflux disease)   . Hypercholesterolemia   . Osteoporosis   . Primary peritoneal carcinomatosis ([place])    presumed Stage 2C  . Thyroid disease     Oncology Hx:    Primary peritoneal  carcinomatosis ([place])   01/10/2014 Initial Diagnosis    Primary peritoneal carcinomatosis ([place]), [facility]      03/27/2014 - 07/10/2014 Chemotherapy    Status post 6 cycles of paclitaxel and carboplatin.       08/15/2014 - 09/26/2014 Radiation Therapy    5040 cGy in 28 fractions to the right pelvis.       Family Hx:  Family History  Problem Relation Age of Onset  . Colon cancer Father        ? late 43's early 12's.. Died at age 75.  . Skin cancer Father   . Stomach cancer Maternal Aunt   . Esophageal cancer Neg Hx   . Pancreatic cancer Neg Hx   . Prostate cancer Neg Hx   . Rectal cancer Neg Hx     Review of Systems  Constitutional: Positive for unexpected weight change.  HENT:  Negative.   Eyes: Negative.   Respiratory: Negative.   Cardiovascular: Negative.   Gastrointestinal: Negative.   Endocrine: Negative.   Genitourinary: Negative.    Musculoskeletal: Negative.   Skin: Negative.   Neurological: Negative.   Hematological: Negative.   Psychiatric/Behavioral: Negative.     Vitals:  Blood pressure 135/76, pulse 72, temperature 98.7 F (37.1 C), temperature source Oral, resp. rate 18, height 5' 6.5" (1.689 m), weight 130 lb (59 kg), SpO2 100 %.  Physical Exam: ECOG PERFORMANCE STATUS: 0 - Asymptomatic   General :  Well developed, _0 @, female in no apparent distress HEENT:  Normocephalic/atraumatic, symmetric, EOMI, eyelids normal Neck:   Supple, no masses.  Lymphatics:  No cervical/ submandibular/  supraclavicular/ infraclavicular/ inguinal adenopathy Respiratory:  Respirations unlabored, no use of accessory muscles CV:   Deferred Breast:  Deferred Musculoskeletal: No CVA tenderness, normal muscle strength. Abdomen:  Soft, non-tender and nondistended. No evidence of hernia. No masses. Extremities:  No lymphedema, no erythema, non-tender. Skin:   Normal inspection Neuro/Psych:  No focal motor deficit, no abnormal mental status. Normal gait. Normal affect. Alert and oriented to person, place, and time  Genito Urinary: Vulva: Normal external female genitalia.  Bladder/urethra: Urethral meatus normal in size and location. No lesions or   masses, well supported bladder Speculum exam: Vagina: No lesion, no discharge, no bleeding. Cervix: Surgically absent Bimanual exam: Difficult exam due to patient intolerance  Uterus: Surgically absent  Adnexa: No masses. Rectovaginal:  Deferred  Assessment/Plan: 72 year old with a history of both breast cancer as well as a presumed stage IIc primary peritoneal carcinoma diagnosed in 01/2014, completing treatment 09/2014. She was not comprehensively surgically staged. There was tumor on the posterior aspect of the uterus based on the operative note. This was consistent with clear cell carcinoma but the omentum was negative. There was no lymph node staging. She underwent 6 cycles of chemotherapy with paclitaxel and carboplatin followed by pelvic radiation therapy.   She's been free of disease based primarily on CA-125's and CT scans.  1. We'll follow-up in results of her CA-125 from today.  2. She'll return to see me in 6 months.  3. It is her expectation that she would have a CT scan in November every year for 5 years as that is the plan that was outlined by her prior medical oncologist. We will order CT scan for November 2019 (to be done about 1 week prior to return). 4. I would be interested if we have a copy of her genetics report.   Isabel Caprice, MD 01/20/2018, 5:49 PM   Cc: Monico Blitz,  MD 

## 2018-05-28 ENCOUNTER — Encounter: Payer: Self-pay | Admitting: *Deleted

## 2018-06-02 ENCOUNTER — Encounter: Payer: Self-pay | Admitting: Internal Medicine

## 2018-06-02 ENCOUNTER — Ambulatory Visit: Payer: Medicare Other | Admitting: Internal Medicine

## 2018-06-02 VITALS — BP 122/82 | HR 59 | Ht 66.0 in | Wt 128.0 lb

## 2018-06-02 DIAGNOSIS — K219 Gastro-esophageal reflux disease without esophagitis: Secondary | ICD-10-CM

## 2018-06-02 DIAGNOSIS — Z86010 Personal history of colonic polyps: Secondary | ICD-10-CM | POA: Diagnosis not present

## 2018-06-02 DIAGNOSIS — K5909 Other constipation: Secondary | ICD-10-CM | POA: Diagnosis not present

## 2018-06-02 MED ORDER — OMEPRAZOLE 20 MG PO CPDR: 20.0000 mg | DELAYED_RELEASE_CAPSULE | Freq: Every day | ORAL | 3 refills | Status: DC

## 2018-06-02 NOTE — Patient Instructions (Signed)
We have sent the following medications to your pharmacy for you to pick up at your convenience: Omeprazole 40 mg daily  Please follow up with Dr Hilarie Fredrickson in 1 year.  If you are age 74 or older, your body mass index should be between 23-30. Your Body mass index is 20.66 kg/m. If this is out of the aforementioned range listed, please consider follow up with your Primary Care Provider.  If you are age 6 or younger, your body mass index should be between 19-25. Your Body mass index is 20.66 kg/m. If this is out of the aformentioned range listed, please consider follow up with your Primary Care Provider.

## 2018-06-02 NOTE — Progress Notes (Signed)
Subjective:    Patient ID: Andrea Simpson, female    DOB: 11/07/1945, 72 y.o.   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  We did a colonoscopy on 08/10/2017.  She reports recovering from this uneventfully.  She had 2 polyps the largest 5 mm removed from her colon.  These were tubular adenoma without high-grade dysplasia.  Anal papilla were hypertrophied and the exam was otherwise normal.  She has continue MiraLAX 17 g daily which keep her bowel habits regular.  She does report having issues with her acid reflux which seem to come and go for her.  She has been using omeprazole but more on a sporadic basis.  She is treating her heartburn with omeprazole 20 mg which she will sometimes take for 4 days in a row and then not need it for several weeks and at other times she will use it every other day to every 2 days.  She feels that this regimen seems to be working.  When she does have symptoms it is traditional heartburn.  She has not had dysphagia or odynophagia.  No abdominal pain.  No nausea or vomiting or early satiety now.  No weight loss.  She will occasionally have nausea when her reflux symptoms are most severe and if present it will be in the morning.  She did have a single episode of vomiting in May 2019 after eating a very rich and heavy meal while vacationing.  She did have an upper endoscopy by Dr. Laural Golden which I reviewed.  This was performed on 12/12/2008.  It showed a small sliding hiatal hernia with no evidence of erosive esophagitis or Barrett's esophagus.  There was nonerosive antral gastritis and a small fundic gland polyp.  The polyp was biopsied and consistent with fundic gland  polyp.  Review of Systems As per HPI, otherwise negative  Current Medications, Allergies, Past Medical History, Past Surgical History, Family History and Social History were reviewed in [employer] record.     Objective:   Physical Exam BP 122/82   Pulse (!) 59   Ht 5\' 6"  (1.676 m)   Wt 128 lb (58.1 kg)   BMI 20.66 kg/m  Constitutional: Well-developed and well-nourished. No distress. HEENT: Normocephalic and atraumatic.  Conjunctivae are normal.  No scleral icterus. Neck: Neck supple. Trachea midline. Cardiovascular: Normal rate, regular rhythm and intact distal pulses. No M/R/G Pulmonary/chest: Effort normal and breath sounds normal. No wheezing, rales or rhonchi. Abdominal: Soft, nontender, nondistended. Bowel sounds active throughout. There are no masses palpable. No hepatosplenomegaly. Extremities: no clubbing, cyanosis, or edema Neurological: Alert and oriented to person place and time. Skin: Skin is warm and dry. Psychiatric: Normal mood and affect. Behavior is normal.      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 GERD --traditional heartburn and likely somewhat related to her small hiatal hernia.  We discussed management extensively today including mechanism of action for PPI.  She had read about "withdrawal"  but this seems to be more likely rebound phenomenon which is possible with chronic PPI use.  I do think she would do better on a more stable/regular dose of PPI and this would likely control symptoms better rather than reactively treating symptoms.  My recommendation would be for omeprazole daily at 20 mg.  No alarm symptoms to warrant repeat upper endoscopy  at this time.  She is happy with this plan.  2.  Constipation --continue MiraLAX 17 g once daily  3.  History of small colon polyps and family history of colon cancer --surveillance colonoscopy recommended at the 5-year mark which for her would be December 2023  Annual follow-up, sooner if needed 25 minutes spent with the patient today. Greater than 50% was spent in counseling and coordination of care with the patient

## 2018-06-21 ENCOUNTER — Telehealth: Payer: Self-pay | Admitting: *Deleted

## 2018-06-21 ENCOUNTER — Inpatient Hospital Stay: Payer: Medicare Other | Attending: Obstetrics

## 2018-06-21 DIAGNOSIS — Z9221 Personal history of antineoplastic chemotherapy: Secondary | ICD-10-CM | POA: Insufficient documentation

## 2018-06-21 DIAGNOSIS — C482 Malignant neoplasm of peritoneum, unspecified: Secondary | ICD-10-CM | POA: Insufficient documentation

## 2018-06-21 DIAGNOSIS — Z87891 Personal history of nicotine dependence: Secondary | ICD-10-CM | POA: Diagnosis not present

## 2018-06-21 DIAGNOSIS — Z90722 Acquired absence of ovaries, bilateral: Secondary | ICD-10-CM | POA: Diagnosis not present

## 2018-06-21 DIAGNOSIS — Z923 Personal history of irradiation: Secondary | ICD-10-CM | POA: Diagnosis not present

## 2018-06-21 DIAGNOSIS — C569 Malignant neoplasm of unspecified ovary: Secondary | ICD-10-CM

## 2018-06-21 DIAGNOSIS — Z90710 Acquired absence of both cervix and uterus: Secondary | ICD-10-CM | POA: Insufficient documentation

## 2018-06-21 LAB — BASIC METABOLIC PANEL
ANION GAP: 10 (ref 5–15)
BUN: 16 mg/dL (ref 8–23)
CO2: 26 mmol/L (ref 22–32)
Calcium: 10 mg/dL (ref 8.9–10.3)
Chloride: 105 mmol/L (ref 98–111)
Creatinine, Ser: 1.05 mg/dL — ABNORMAL HIGH (ref 0.44–1.00)
GFR, EST AFRICAN AMERICAN: 59 mL/min — AB (ref >60)
GFR, EST NON AFRICAN AMERICAN: 51 mL/min — AB (ref >60)
Glucose, Bld: 106 mg/dL — ABNORMAL HIGH (ref 70–99)
Potassium: 4.2 mmol/L (ref 3.5–5.1)
SODIUM: 141 mmol/L (ref 135–145)

## 2018-06-21 NOTE — Telephone Encounter (Signed)
Called and moved the appt from 10/23 to 10/16 per MD

## 2018-06-22 LAB — CA 125: Cancer Antigen (CA) 125: 10.4 U/mL (ref 0.0–38.1)

## 2018-06-23 ENCOUNTER — Telehealth: Payer: Self-pay | Admitting: Oncology

## 2018-06-23 ENCOUNTER — Inpatient Hospital Stay (HOSPITAL_BASED_OUTPATIENT_CLINIC_OR_DEPARTMENT_OTHER): Payer: Medicare Other | Admitting: Obstetrics

## 2018-06-23 ENCOUNTER — Ambulatory Visit (HOSPITAL_COMMUNITY)
Admission: RE | Admit: 2018-06-23 | Discharge: 2018-06-23 | Disposition: A | Discharge status: Home / Self Care | Payer: Medicare Other | Source: Ambulatory Visit | Attending: Obstetrics | Admitting: Obstetrics

## 2018-06-23 ENCOUNTER — Encounter (HOSPITAL_COMMUNITY): Payer: Self-pay

## 2018-06-23 ENCOUNTER — Encounter: Payer: Self-pay | Admitting: Obstetrics

## 2018-06-23 VITALS — BP 139/66 | HR 84 | Temp 98.4°F | Resp 20 | Ht 66.5 in | Wt 128.3 lb

## 2018-06-23 DIAGNOSIS — Z90722 Acquired absence of ovaries, bilateral: Secondary | ICD-10-CM | POA: Diagnosis not present

## 2018-06-23 DIAGNOSIS — M84454D Pathological fracture, pelvis, subsequent encounter for fracture with routine healing: Secondary | ICD-10-CM | POA: Diagnosis not present

## 2018-06-23 DIAGNOSIS — Z800 Family history of malignant neoplasm of digestive organs: Secondary | ICD-10-CM | POA: Diagnosis not present

## 2018-06-23 DIAGNOSIS — Z9221 Personal history of antineoplastic chemotherapy: Secondary | ICD-10-CM | POA: Diagnosis not present

## 2018-06-23 DIAGNOSIS — I700 Atherosclerosis of aorta: Secondary | ICD-10-CM | POA: Insufficient documentation

## 2018-06-23 DIAGNOSIS — K635 Polyp of colon: Secondary | ICD-10-CM | POA: Insufficient documentation

## 2018-06-23 DIAGNOSIS — K219 Gastro-esophageal reflux disease without esophagitis: Secondary | ICD-10-CM | POA: Diagnosis not present

## 2018-06-23 DIAGNOSIS — C419 Malignant neoplasm of bone and articular cartilage, unspecified: Secondary | ICD-10-CM | POA: Insufficient documentation

## 2018-06-23 DIAGNOSIS — C482 Malignant neoplasm of peritoneum, unspecified: Secondary | ICD-10-CM

## 2018-06-23 DIAGNOSIS — Z923 Personal history of irradiation: Secondary | ICD-10-CM

## 2018-06-23 DIAGNOSIS — Z90710 Acquired absence of both cervix and uterus: Secondary | ICD-10-CM | POA: Diagnosis not present

## 2018-06-23 DIAGNOSIS — C569 Malignant neoplasm of unspecified ovary: Secondary | ICD-10-CM | POA: Insufficient documentation

## 2018-06-23 MED ORDER — IOHEXOL 300 MG/ML  SOLN
100.0000 mL | Freq: Once | INTRAMUSCULAR | Status: AC | PRN
  Administered 2018-06-23: 100 mL via INTRAVENOUS

## 2018-06-23 MED ORDER — SODIUM CHLORIDE 0.9 % IJ SOLN
INTRAMUSCULAR | Status: AC
  Filled 2018-06-23: qty 50

## 2018-06-23 NOTE — Telephone Encounter (Signed)
Faxed request to [facility] requesting genetic testing records to [phone].

## 2018-06-23 NOTE — Patient Instructions (Signed)
1. Return in 6 months for pelvic and CA125 2. If you have genetic testing results (BRCA or equivalent) please bring to your next appointment

## 2018-06-23 NOTE — Progress Notes (Signed)
Consult Note: Gyn-Onc  CC:  Chief Complaint  Patient presents with  . Primary peritoneal carcinomatosis [facility])  GYN Oncologic Summary 1. 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   Taken to surgery with GYN 01/2014 - tumor on the posterior aspect of the uterus based on the operative note; omentum was negative. There was no lymph node staging.   09/2014 completed 6 cycles of chemotherapy with paclitaxel and carboplatin followed by pelvic radiation therapy with Dr. Sondra Come 2. Reportedly BRCA negative pending genetic results  HPI: Patient is a 72 y.o. gravida 0    Interval History:  She was last here 6 months ago. Since that time she has noticed some change in her balance she attributes to neuropathy. Also some joint pain. She is a bit concerned about her belly and weight distribution being different than it was before treatment.  CA125 done this week was normal.    Presenting History: She was diagnosed with a pelvic mass and an elevated CA-125 (60s per patient report) in May 2015. On 01/10/2014 she underwent a TAH/BSO with her primary Gyn. Findings included what appeared to be bilateral normal ovaries with an "inflammatory mass" involving the back of the uterus, the right round ligament and towards the right pelvic sidewall. The mass was apparently sent for frozen section was felt to be benign. Final pathology however revealed an adenocarcinoma with implants and soft tissue posterior to the uterus. Per the pathology report, the adnexa and fallopian tubes were negative. The tissue posterior to the uterus was a clear cell carcinoma. The tumor was ER negative, CK 7 positive. This was felt to be most consistent with a clear cell carcinoma.   Per the patient's report, she then developed a partial small bowel obstruction and went back to the operating room on May 13. At that time omentum was excised that was benign. The patient was treated (in  [place] with a [employer]) with 6 cycles of paclitaxel and carboplatin based chemotherapy which she completed from 03/27/2014 to 07/10/2014. She then underwent right pelvic radiation (with Dr. Sondra Come) from 08/15/2014 to 09/26/2014.  She's been free of disease since that time.   She's had CA-125 performed on a regular basis. CA-125 "June 27" was 10.1. She's also been undergoing annual CT scans of the abdomen and pelvis in November of every year. She had not been having routine pelvic exams. She did have genetic testing and per her report she is "BRCA negative". The patient does have a personal history of breast cancer back in 2013. She underwent a right partial mastectomy which revealed invasive ductal carcinoma measuring 1.5 cm. The tumor was grade 3 with 2 negative sentinel lymph nodes. Tumor was ER positive PR negative HER-2 positive. Ki-67 marker was 20%. The patient declined chemotherapy and targeted therapy. She did undergo radiation to the right breast with 5040 cGy. She then took Arimidex for 5 years which she completed in October 2008.  Measurement of disease: CA125   preop reported to be in the "60's"  02/23/17 - 13  12/30/2017 - 11.3  06/21/18 - 10.4  Recent Labs    07/01/17 1159 12/30/17 1550 06/21/18 1226  CAN125 12.0 11.3 10.4     Radiology:  CT11/14/2016 revealed no evidence of peritoneal lesions or ascites  CT 06/26/17: IMPRESSION: Stable exam. No evidence of recurrent or metastatic disease in the abdomen or pelvis. Ct Chest W Contrast  Result Date: 06/23/2018 CLINICAL DATA:  History of right breast  cancer 2003. History of presumed primary peritoneal carcinoma in 2015 status post TAH-BSO, radiation therapy and chemotherapy. Patient presents for routine restaging. EXAM: CT CHEST, ABDOMEN, AND PELVIS WITH CONTRAST TECHNIQUE: Multidetector CT imaging of the chest, abdomen and pelvis was performed following the standard protocol during bolus administration of  intravenous contrast. CONTRAST:  153m OMNIPAQUE IOHEXOL 300 MG/ML  SOLN COMPARISON:  06/26/2017 CT abdomen/pelvis. 07/25/2014 CT chest, abdomen and pelvis. FINDINGS: CT CHEST FINDINGS Cardiovascular: Normal heart size. No significant pericardial effusion/thickening. Atherosclerotic nonaneurysmal thoracic aorta. Normal caliber pulmonary arteries. No central pulmonary emboli. Mediastinum/Nodes: No discrete thyroid nodules. Unremarkable esophagus. No pathologically enlarged axillary, mediastinal or hilar lymph nodes. Lungs/Pleura: No pneumothorax. No pleural effusion. No acute consolidative airspace disease, lung masses or significant pulmonary nodules. Mild sharply marginated subpleural consolidation in the anterior right middle lobe (series 9/image 72) is stable since 07/25/2014 chest CT, compatible with mild radiation fibrosis. Musculoskeletal:  No aggressive appearing focal osseous lesions. CT ABDOMEN PELVIS FINDINGS Hepatobiliary: Normal liver size. Subcentimeter right liver lobe hypodense lesion (series 2/image 60) is too small to characterize and not appreciably changed back to 07/25/2014 CT, considered benign. No new liver lesions. Normal gallbladder with no radiopaque cholelithiasis. No biliary ductal dilatation. Pancreas: Normal, with no mass or duct dilation. Spleen: Normal size. No mass. Adrenals/Urinary Tract: Normal adrenals. Simple parapelvic renal cysts in the left greater than right kidneys. No hydronephrosis. Subcentimeter hypodense renal cortical lesion in the upper left kidney is too small to characterize and is unchanged, considered benign. No new renal lesions. Normal bladder. Stomach/Bowel: Normal non-distended stomach. Normal caliber small bowel with no small bowel wall thickening. Normal appendix. Oral contrast transits to rectum. Minimal sigmoid diverticulosis, with no large bowel wall thickening or acute pericolonic fat stranding. Vascular/Lymphatic: Atherosclerotic nonaneurysmal abdominal  aorta. Patent portal, splenic, hepatic and renal veins. No pathologically enlarged lymph nodes in the abdomen or pelvis. Reproductive: Status post hysterectomy, with no abnormal findings at the vaginal cuff. No adnexal mass. Other: No pneumoperitoneum, ascites or focal fluid collection. No peritoneal nodules detected. Musculoskeletal: No aggressive appearing focal osseous lesions. Linear sclerosis in the right iliac crest (series 2/image 86), representing healed fracture as seen on prior CT. Mild lumbar spondylosis. IMPRESSION: 1. No evidence of recurrent peritoneal carcinomatosis. No evidence of metastatic disease in the chest, abdomen or pelvis. 2. Linear sclerosis in the right iliac crest represents interval healing of a nondisplaced fracture seen in this location on prior CT. 3.  Aortic Atherosclerosis (ICD10-I70.0). Electronically Signed   By: JIlona SorrelM.D.   On: 06/23/2018 11:03   Ct Abdomen Pelvis W Contrast  Result Date: 06/23/2018 CLINICAL DATA:  History of right breast cancer 2003. History of presumed primary peritoneal carcinoma in 2015 status post TAH-BSO, radiation therapy and chemotherapy. Patient presents for routine restaging. EXAM: CT CHEST, ABDOMEN, AND PELVIS WITH CONTRAST TECHNIQUE: Multidetector CT imaging of the chest, abdomen and pelvis was performed following the standard protocol during bolus administration of intravenous contrast. CONTRAST:  1067mOMNIPAQUE IOHEXOL 300 MG/ML  SOLN COMPARISON:  06/26/2017 CT abdomen/pelvis. 07/25/2014 CT chest, abdomen and pelvis. FINDINGS: CT CHEST FINDINGS Cardiovascular: Normal heart size. No significant pericardial effusion/thickening. Atherosclerotic nonaneurysmal thoracic aorta. Normal caliber pulmonary arteries. No central pulmonary emboli. Mediastinum/Nodes: No discrete thyroid nodules. Unremarkable esophagus. No pathologically enlarged axillary, mediastinal or hilar lymph nodes. Lungs/Pleura: No pneumothorax. No pleural effusion. No acute  consolidative airspace disease, lung masses or significant pulmonary nodules. Mild sharply marginated subpleural consolidation in the anterior right middle lobe (series 9/image 72)  is stable since 07/25/2014 chest CT, compatible with mild radiation fibrosis. Musculoskeletal:  No aggressive appearing focal osseous lesions. CT ABDOMEN PELVIS FINDINGS Hepatobiliary: Normal liver size. Subcentimeter right liver lobe hypodense lesion (series 2/image 60) is too small to characterize and not appreciably changed back to 07/25/2014 CT, considered benign. No new liver lesions. Normal gallbladder with no radiopaque cholelithiasis. No biliary ductal dilatation. Pancreas: Normal, with no mass or duct dilation. Spleen: Normal size. No mass. Adrenals/Urinary Tract: Normal adrenals. Simple parapelvic renal cysts in the left greater than right kidneys. No hydronephrosis. Subcentimeter hypodense renal cortical lesion in the upper left kidney is too small to characterize and is unchanged, considered benign. No new renal lesions. Normal bladder. Stomach/Bowel: Normal non-distended stomach. Normal caliber small bowel with no small bowel wall thickening. Normal appendix. Oral contrast transits to rectum. Minimal sigmoid diverticulosis, with no large bowel wall thickening or acute pericolonic fat stranding. Vascular/Lymphatic: Atherosclerotic nonaneurysmal abdominal aorta. Patent portal, splenic, hepatic and renal veins. No pathologically enlarged lymph nodes in the abdomen or pelvis. Reproductive: Status post hysterectomy, with no abnormal findings at the vaginal cuff. No adnexal mass. Other: No pneumoperitoneum, ascites or focal fluid collection. No peritoneal nodules detected. Musculoskeletal: No aggressive appearing focal osseous lesions. Linear sclerosis in the right iliac crest (series 2/image 86), representing healed fracture as seen on prior CT. Mild lumbar spondylosis. IMPRESSION: 1. No evidence of recurrent peritoneal  carcinomatosis. No evidence of metastatic disease in the chest, abdomen or pelvis. 2. Linear sclerosis in the right iliac crest represents interval healing of a nondisplaced fracture seen in this location on prior CT. 3.  Aortic Atherosclerosis (ICD10-I70.0). Electronically Signed   By: Ilona Sorrel M.D.   On: 06/23/2018 11:03        Current Meds:  Outpatient Encounter Medications as of 06/23/2018  Medication Sig  . azelastine (ASTELIN) 0.1 % nasal spray Place into the nose 2 (two) times daily as needed.   Marland Kitchen denosumab (PROLIA) 60 MG/ML SOLN injection Inject 60 mg into the skin every 6 (six) months. Administer in upper arm, thigh, or abdomen  . levothyroxine (SYNTHROID, LEVOTHROID) 25 MCG tablet Take 25 mcg by mouth daily.   . Multiple Vitamin (MULTIVITAMIN) tablet Take 1 tablet by mouth daily.  Marland Kitchen omeprazole (PRILOSEC) 20 MG capsule Take 1 capsule (20 mg total) by mouth daily. (Patient taking differently: Take 20 mg by mouth every other day. )  . polyethylene glycol (MIRALAX / GLYCOLAX) packet Take by mouth 2 (two) times daily.   . simvastatin (ZOCOR) 10 MG tablet Take 10 mg by mouth at bedtime.   No facility-administered encounter medications on file as of 06/23/2018.     Allergy:  Allergies  Allergen Reactions  . Penicillins     Felt like "pins and needles" on skin  . Tramadol Dermatitis    Itching , cough   . Keflex [Cephalexin] Rash    Social Hx:   Social History   Socioeconomic History  . Marital status: Single    Spouse name: Not on file  . Number of children: Not on file  . Years of education: Not on file  . Highest education level: Not on file  Occupational History  . Not on file  Social Needs  . Financial resource strain: Not on file  . Food insecurity:    Worry: Not on file    Inability: Not on file  . Transportation needs:    Medical: Not on file    Non-medical: Not on file  Tobacco  Use  . Smoking status: Former Smoker    Last attempt to quit: 05/04/1978     Years since quitting: 40.1  . Smokeless tobacco: Never Used  Substance and Sexual Activity  . Alcohol use: No    Alcohol/week: 0.0 standard drinks  . Drug use: No  . Sexual activity: Not on file  Lifestyle  . Physical activity:    Days per week: Not on file    Minutes per session: Not on file  . Stress: Not on file  Relationships  . Social connections:    Talks on phone: Not on file    Gets together: Not on file    Attends religious service: Not on file    Active member of club or organization: Not on file    Attends meetings of clubs or organizations: Not on file    Relationship status: Not on file  . Intimate partner violence:    Fear of current or ex partner: Not on file    Emotionally abused: Not on file    Physically abused: Not on file    Forced sexual activity: Not on file  Other Topics Concern  . Not on file  Social History Narrative  . Not on file    Past Surgical Hx:  Past Surgical History:  Procedure Laterality Date  . abdominal laparoscopic surgery  2015   bowel obstruction  . BREAST LUMPECTOMY  2003   bilateral  . COLONOSCOPY    . MASTECTOMY Bilateral    [id number] -Multiple biopsies  . portcath     . TOTAL ABDOMINAL HYSTERECTOMY W/ BILATERAL SALPINGOOPHORECTOMY  2015  . UPPER GASTROINTESTINAL ENDOSCOPY    . wisdom teth extraction      Past Medical Hx:  Past Medical History:  Diagnosis Date  . Bowel obstruction ([place])    2015  . BRCA negative   . Breast cancer ([place])   . Cataract   . GERD (gastroesophageal reflux disease)   . Hypercholesterolemia   . Osteoporosis   . Peritoneal carcinoma ([place]) 2015  . Primary peritoneal carcinomatosis ([place])    presumed Stage 2C  . Thyroid disease   . Tubular adenoma of colon     Oncology Hx:    Primary peritoneal carcinomatosis ([place])   01/10/2014 Initial Diagnosis    Primary peritoneal carcinomatosis ([place]), IIC    03/27/2014 - 07/10/2014 Chemotherapy    Status post 6 cycles of paclitaxel and  carboplatin.     08/15/2014 - 09/26/2014 Radiation Therapy    5040 cGy in 28 fractions to the right pelvis.     Family Hx:  Family History  Problem Relation Age of Onset  . Colon cancer Father        ? late 11's early 61's.. Died at age 20.  . Skin cancer Father   . Stomach cancer Maternal Aunt   . Esophageal cancer Neg Hx   . Pancreatic cancer Neg Hx   . Prostate cancer Neg Hx   . Rectal cancer Neg Hx    Review of Systems  Constitutional: Negative.   HENT:  Negative.   Eyes: Negative.   Respiratory: Negative.   Cardiovascular: Negative.   Gastrointestinal: Negative.   Endocrine: Negative.   Genitourinary: Negative.    Musculoskeletal: Positive for arthralgias and gait problem.  Skin: Negative.   Neurological: Positive for gait problem and numbness.  Hematological: Negative.   Psychiatric/Behavioral: Negative.     Vitals:  Blood pressure 139/66, pulse 84, temperature 98.4 F (36.9 C), temperature source Oral,  resp. rate 20, height 5' 6.5" (1.689 m), weight 128 lb 4.8 oz (58.2 kg), SpO2 99 %.  Physical Exam: ECOG PERFORMANCE STATUS: 0 - Asymptomatic General :  Well developed, 72 y.o. female in no apparent distress HEENT:  Normocephalic/atraumatic, symmetric, EOMI, eyelids normal Neck:   Supple, no masses.  Lymphatics:  No cervical/ submandibular/ supraclavicular/ infraclavicular/ inguinal adenopathy Respiratory:  Respirations unlabored, no use of accessory muscles CV:   Deferred Breast:  Deferred Musculoskeletal: No CVA tenderness, normal muscle strength. Abdomen:  Soft, non-tender and nondistended. No evidence of hernia. No masses. Extremities:  No lymphedema, no erythema, non-tender. Skin:   Normal inspection Neuro/Psych:  No focal motor deficit, no abnormal mental status. Normal gait. Normal affect. Alert and oriented to person, place, and time  Genito Urinary: Vulva: Normal external female genitalia.  Bladder/urethra: Urethral meatus normal in size and  location. No lesions or   masses, well supported bladder Speculum exam: Vagina: No lesion, no discharge, no bleeding. Cervix: Surgically absent Bimanual exam: Difficult exam due to patient intolerance  Uterus: Surgically absent  Adnexa: No masses. Rectovaginal:  Deferred  Assessment/Plan: History of both breast cancer as well as a presumed stage IIc primary peritoneal carcinoma diagnosed in 01/2014, completing treatment (chemo and RT) 09/2014.  She's been free of disease based primarily on CA-125's and CT scans.  1. Surveillance plan  CA 125 each visit  RTC Q 6 months until 09/2019  Given her radiation and unusual presentation I think we should follow her out beyond 5 years NED  Plan CT scan annually until 5 years NED; next due Nov 2020  2. I would be interested if we have a copy of her genetics report. I discussed with [facility] today and we will try to get a copy from her prior oncology records. The patient will look at home for records too.   Face to face time with patient was 15 minutes. Over 50% of this time was spent on counseling and coordination of care.   Isabel Caprice, MD 06/23/2018, 3:27 PM   Cc: Monico Blitz, MD

## 2018-06-30 ENCOUNTER — Ambulatory Visit: Payer: Medicare Other | Admitting: Obstetrics

## 2018-09-10 ENCOUNTER — Telehealth: Payer: Self-pay | Admitting: *Deleted

## 2018-09-10 NOTE — Telephone Encounter (Signed)
Returned patient's call and scheduled her for a lab/MD visit in April

## 2018-11-01 ENCOUNTER — Telehealth: Payer: Self-pay | Admitting: *Deleted

## 2018-11-01 NOTE — Telephone Encounter (Signed)
Called and moved the patient's appts from Dr. Gerarda Fraction on 4/15 to Dr. Skeet Latch on 4/23

## 2018-12-10 ENCOUNTER — Telehealth: Payer: Self-pay | Admitting: *Deleted

## 2018-12-10 NOTE — Telephone Encounter (Signed)
Called and spoke with the patient regarding her appt on 4/23. Appt moved to 5/28 due to hospital policy; to move all routine appts out several weeks due to COVID-19

## 2018-12-22 ENCOUNTER — Other Ambulatory Visit: Payer: Medicare Other

## 2018-12-22 ENCOUNTER — Ambulatory Visit: Payer: Medicare Other | Admitting: Obstetrics

## 2018-12-30 ENCOUNTER — Other Ambulatory Visit: Payer: Medicare Other

## 2018-12-30 ENCOUNTER — Ambulatory Visit: Payer: Medicare Other | Admitting: Gynecologic Oncology

## 2019-02-02 ENCOUNTER — Other Ambulatory Visit: Payer: Self-pay | Admitting: Gynecologic Oncology

## 2019-02-02 DIAGNOSIS — C482 Malignant neoplasm of peritoneum, unspecified: Secondary | ICD-10-CM

## 2019-02-02 NOTE — Progress Notes (Signed)
CA 125 ordered.

## 2019-02-03 ENCOUNTER — Inpatient Hospital Stay: Payer: Medicare Other | Admitting: Gynecologic Oncology

## 2019-02-03 ENCOUNTER — Telehealth: Payer: Self-pay | Admitting: *Deleted

## 2019-02-03 ENCOUNTER — Inpatient Hospital Stay: Payer: Medicare Other

## 2019-02-03 NOTE — Telephone Encounter (Signed)
Patient called and canceled her appt for today. Patient stated that "I'm not feeling well and having a irregular heart beat. I don't think I should drive to [facility]." Rescheduled the patient's appts from today to 7/2. Patient not having an issues or concerns at this time for GYN ONC. Explained to call the office before her appt if she does. Patient stated that she is been seen for the irregular heart beat. Melissa APP and Dr. Skeet Latch

## 2019-03-10 ENCOUNTER — Inpatient Hospital Stay: Payer: Medicare Other

## 2019-03-10 ENCOUNTER — Inpatient Hospital Stay: Payer: Medicare Other | Attending: Gynecologic Oncology | Admitting: Gynecologic Oncology

## 2019-03-10 ENCOUNTER — Other Ambulatory Visit: Payer: Self-pay

## 2019-03-10 VITALS — BP 134/86 | HR 59 | Temp 98.5°F | Resp 18 | Ht 66.5 in | Wt 128.6 lb

## 2019-03-10 DIAGNOSIS — Z853 Personal history of malignant neoplasm of breast: Secondary | ICD-10-CM

## 2019-03-10 DIAGNOSIS — E7800 Pure hypercholesterolemia, unspecified: Secondary | ICD-10-CM | POA: Insufficient documentation

## 2019-03-10 DIAGNOSIS — E079 Disorder of thyroid, unspecified: Secondary | ICD-10-CM | POA: Insufficient documentation

## 2019-03-10 DIAGNOSIS — C482 Malignant neoplasm of peritoneum, unspecified: Secondary | ICD-10-CM | POA: Diagnosis not present

## 2019-03-10 DIAGNOSIS — Z90722 Acquired absence of ovaries, bilateral: Secondary | ICD-10-CM

## 2019-03-10 DIAGNOSIS — Z1371 Encounter for nonprocreative screening for genetic disease carrier status: Secondary | ICD-10-CM

## 2019-03-10 DIAGNOSIS — Z9221 Personal history of antineoplastic chemotherapy: Secondary | ICD-10-CM

## 2019-03-10 DIAGNOSIS — K219 Gastro-esophageal reflux disease without esophagitis: Secondary | ICD-10-CM | POA: Insufficient documentation

## 2019-03-10 DIAGNOSIS — Z923 Personal history of irradiation: Secondary | ICD-10-CM | POA: Diagnosis not present

## 2019-03-10 DIAGNOSIS — M810 Age-related osteoporosis without current pathological fracture: Secondary | ICD-10-CM | POA: Diagnosis not present

## 2019-03-10 DIAGNOSIS — Z90710 Acquired absence of both cervix and uterus: Secondary | ICD-10-CM

## 2019-03-10 NOTE — Progress Notes (Signed)
GYN ONCOLOGY OFFICE VISIT   CC:  Chief Complaint  Patient presents with  . Primary peritoneal carcinomatosis ([place])  . HX: breast cancer  . BRCA negative   HISTORY OF PRESENT ILLNESS  Oncology History  Primary peritoneal carcinomatosis ([place])  01/10/2014 Initial Diagnosis   Primary peritoneal carcinomatosis ([place]), IIC   03/27/2014 - 07/10/2014 Chemotherapy   Status post 6 cycles of paclitaxel and carboplatin.    08/15/2014 - 09/26/2014 Radiation Therapy   5040 cGy in 28 fractions to the right pelvis.        1. Reportedly BRCA negative pending genetic results  HPI: Patient is a 73 y.o. gravida 0    Interval History:  She was last here 6 months ago. Since that time she has noticed some change in her balance she attributes to neuropathy. Also some joint pain. She is a bit concerned about her belly and weight distribution being different than it was before treatment.  CA125collected this am   Presenting History: She was diagnosed with a pelvic mass and an elevated CA-125 (60s per patient report) in May 2015. On 01/10/2014 she underwent a TAH/BSO with her primary Gyn. Findings included what appeared to be bilateral normal ovaries with an "inflammatory mass" involving the back of the uterus, the right round ligament and towards the right pelvic sidewall. The mass was apparently sent for frozen section was felt to be benign. Final pathology however revealed an adenocarcinoma with implants and soft tissue posterior to the uterus. Per the pathology report, the adnexa and fallopian tubes were negative. The tissue posterior to the uterus was a clear cell carcinoma. The tumor was ER negative, CK 7 positive. This was felt to be most consistent with a clear cell carcinoma.   Per the patient's report, she then developed a partial small bowel obstruction and went back to the operating room on May 13. At that time omentum was excised that was benign. The patient was treated (in [place] with a [facility]) with 6 cycles of paclitaxel and carboplatin based chemotherapy which she completed from 03/27/2014 to 07/10/2014. She then underwent right pelvic radiation (with Dr. Sondra Come) from 08/15/2014 to 09/26/2014.  She's been free of disease since that time.   She's had CA-125 performed on a regular basis. CA-125 "June 27" was 10.1. She's also been undergoing annual CT scans of the abdomen and pelvis in November of every year. She had not been having routine pelvic exams. She did have genetic testing and per her report she is "BRCA negative". The patient does have a personal history of breast cancer back in 2013. She underwent a right partial mastectomy which revealed invasive ductal carcinoma measuring 1.5 cm. The tumor was grade 3 with 2 negative sentinel lymph nodes. Tumor was ER positive PR negative HER-2 positive. Ki-67 marker was 20%. The patient declined chemotherapy and targeted therapy. She did undergo radiation to the right breast with 5040 cGy. She then took Arimidex for 5 years which she completed in October 2008.  Measurement of disease: CA125   preop reported to be in the "60's"  02/23/17 - 13  12/30/2017 - 11.3  06/21/18 - 10.4  Recent Labs    06/21/18 1226  CAN125 10.4     Radiology:  CT11/14/2016 revealed no evidence of peritoneal lesions or ascites  CT 06/26/17: IMPRESSION: Stable exam. No evidence of recurrent or metastatic disease in the abdomen or pelvis. No results found.      Current Meds:  Outpatient Encounter Medications as of  03/10/2019  Medication Sig  . azelastine (ASTELIN) 0.1 % nasal spray Place into the nose 2 (two) times daily as needed.   Marland Kitchen denosumab (PROLIA) 60 MG/ML SOLN injection Inject 60 mg into the skin every 6 (six) months. Administer in upper arm, thigh, or abdomen  . levothyroxine (SYNTHROID, LEVOTHROID) 25 MCG tablet Take 25 mcg by mouth daily.   . Multiple Vitamin (MULTIVITAMIN) tablet Take 1 tablet by mouth daily.  Marland Kitchen  omeprazole (PRILOSEC) 20 MG capsule Take 1 capsule (20 mg total) by mouth daily. (Patient taking differently: Take 20 mg by mouth every other day. )  . polyethylene glycol (MIRALAX / GLYCOLAX) packet Take by mouth 2 (two) times daily.   . simvastatin (ZOCOR) 10 MG tablet Take 10 mg by mouth at bedtime.   No facility-administered encounter medications on file as of 03/10/2019.     Allergy:  Allergies  Allergen Reactions  . Penicillins     Felt like "pins and needles" on skin  . Tramadol Dermatitis    Itching , cough   . Keflex [Cephalexin] Rash    Social Hx:   Social History   Socioeconomic History  . Marital status: Single    Spouse name: Not on file  . Number of children: Not on file  . Years of education: Not on file  . Highest education level: Not on file  Occupational History  . Not on file  Social Needs  . Financial resource strain: Not on file  . Food insecurity    Worry: Not on file    Inability: Not on file  . Transportation needs    Medical: Not on file    Non-medical: Not on file  Tobacco Use  . Smoking status: Former Smoker    Quit date: 05/04/1978    Years since quitting: 40.8  . Smokeless tobacco: Never Used  Substance and Sexual Activity  . Alcohol use: No    Alcohol/week: 0.0 standard drinks  . Drug use: No  . Sexual activity: Not on file  Lifestyle  . Physical activity    Days per week: Not on file    Minutes per session: Not on file  . Stress: Not on file  Relationships  . Social Herbalist on phone: Not on file    Gets together: Not on file    Attends religious service: Not on file    Active member of club or organization: Not on file    Attends meetings of clubs or organizations: Not on file    Relationship status: Not on file  . Intimate partner violence    Fear of current or ex partner: Not on file    Emotionally abused: Not on file    Physically abused: Not on file    Forced sexual activity: Not on file  Other Topics Concern   . Not on file  Social History Narrative  . Not on file    Past Surgical Hx:  Past Surgical History:  Procedure Laterality Date  . abdominal laparoscopic surgery  2015   bowel obstruction  . BREAST LUMPECTOMY  2003   bilateral  . COLONOSCOPY    . MASTECTOMY Bilateral    [id number] -Multiple biopsies  . portcath     . TOTAL ABDOMINAL HYSTERECTOMY W/ BILATERAL SALPINGOOPHORECTOMY  2015  . UPPER GASTROINTESTINAL ENDOSCOPY    . wisdom teth extraction      Past Medical Hx:  Past Medical History:  Diagnosis Date  . Bowel obstruction ([place])  2015  . BRCA negative   . Breast cancer ([place])   . Cataract   . GERD (gastroesophageal reflux disease)   . Hypercholesterolemia   . Osteoporosis   . Peritoneal carcinoma ([place]) 2015  . Primary peritoneal carcinomatosis ([place])    presumed Stage 2C  . Thyroid disease   . Tubular adenoma of colon     Oncology Hx:  Oncology History  Primary peritoneal carcinomatosis ([place])  01/10/2014 Initial Diagnosis   Primary peritoneal carcinomatosis ([place]), IIC   03/27/2014 - 07/10/2014 Chemotherapy   Status post 6 cycles of paclitaxel and carboplatin.    08/15/2014 - 09/26/2014 Radiation Therapy   5040 cGy in 28 fractions to the right pelvis.     Family Hx:  Family History  Problem Relation Age of Onset  . Colon cancer Father        ? late 74's early 41's.. Died at age 4.  . Skin cancer Father   . Stomach cancer Maternal Aunt   . Esophageal cancer Neg Hx   . Pancreatic cancer Neg Hx   . Prostate cancer Neg Hx   . Rectal cancer Neg Hx    Review of Systems  Constitutional: Negative.  Negative for appetite change, chills, diaphoresis, fatigue and fever.  HENT:  Negative.   Eyes: Negative.   Respiratory: Positive for chest tightness.        Diagnosed with SVT  Cardiovascular: Negative.   Gastrointestinal: Negative.  Negative for abdominal distention, abdominal pain, blood in stool, rectal pain and vomiting.  Endocrine: Negative.    Genitourinary: Negative.  Negative for difficulty urinating.   Musculoskeletal: Positive for gait problem. Negative for arthralgias.  Skin: Negative.   Neurological: Positive for gait problem and numbness.  Hematological: Negative.   Psychiatric/Behavioral: Negative.     Vitals:  Blood pressure 134/86, pulse (!) 59, temperature 98.5 F (36.9 C), temperature source Oral, resp. rate 18, height 5' 6.5" (1.689 m), weight 128 lb 9.6 oz (58.3 kg), SpO2 100 %. Body mass index is 20.45 kg/m.  Physical Exam: ECOG PERFORMANCE STATUS: 0 - Asymptomatic General :  Well developed, 73 y.o. female in no apparent distress HEENT:  Normocephalic/atraumatic, symmetric, EOMI, eyelids normal Neck:   Supple, no masses.  Lymphatics:  No cervical/ submandibular/ supraclavicular/ infraclavicular/ inguinal adenopathy Respiratory:  Respirations unlabored, no use of accessory muscles CV:   Deferred Breast:  Deferred Musculoskeletal: No CVA tenderness, normal muscle strength. Abdomen:  Soft, non-tender and nondistended. No evidence of hernia. No masses. Extremities:  No lymphedema, no erythema, non-tender. Skin:   Normal inspection Neuro/Psych:  No focal motor deficit, no abnormal mental status. Normal gait. Normal affect. Alert and oriented to person, place, and time  Genito Urinary: Vulva: Normal external female genitalia.  Bladder/urethra: Urethral meatus normal in size and location. No lesions or   masses, well supported bladder Speculum exam: Vagina: No lesion, no discharge, no bleeding. Cervix: Surgically absent Bimanual exam:Limited exam due to patient intolerance.  Narrow vagina, with agglutination and pain   Uterus: Surgically absent  Adnexa: No masses.   Assessment/Plan: History of both breast cancer as well as a presumed stage IIc primary peritoneal carcinoma diagnosed in 01/2014, completing treatment (chemo and RT) 09/2014.  She's been free of disease based primarily on CA-125's and CT  scans.  1. Surveillance plan  CA 125 collected today  Given her radiation and unusual presentation Dr/ Gerarda Fraction recommended follow-up  beyond 5 years NED  Plan CT scan annually.  Next due Oct 2020   F/u  06/2019.  Will discuss subsequent surveillance strategy    Cc: Monico Blitz, MD

## 2019-03-10 NOTE — Patient Instructions (Signed)
CT Chest Abd Pelvis 10/20 F/U 10/20 CA 125  BMP 10/20    Thank you very much Andrea Simpson for allowing me to provide care for you today.  I appreciate your confidence in choosing our Gynecologic Oncology team.  If you have any questions about your visit today please call our office and we will get back to you as soon as possible.  Please consider using the website Medlineplus.gov as an Geneticist, molecular.   Francetta Found. Monna Crean MD., PhD Gynecologic Oncology

## 2019-03-11 LAB — CA 125: Cancer Antigen (CA) 125: 11.3 U/mL (ref 0.0–38.1)

## 2019-03-14 ENCOUNTER — Telehealth: Payer: Self-pay

## 2019-03-14 NOTE — Telephone Encounter (Signed)
Told Ms Krupka that the CA-125 from 03-10-19 was stable and within normal limits at 11.3 per Joylene John, NP.

## 2019-03-20 NOTE — Progress Notes (Signed)
Cardiology Consultation:   Patient ID: Andrea Simpson MRN: [id number]; DOB: April 22, 1946  Admit date: (Not on file) Date of Consult: 03/20/2019  Primary Care Provider: Monico Blitz, MD Primary Cardiologist: New Primary Electrophysiologist:  None     Patient Profile:   Andrea Simpson is a 73 y.o. female with a hx of breast cancer, primary peritoneal carcinomatosis Thyroid disease  who is being seen today for the evaluation of SVT at the request of Dr Manuella Ghazi.  History of Present Illness:   Andrea Simpson 73 y.o. with history of breast cancer, primary peritoneal carcinomatosis , thyroid disease history of port a cath. With palpitations and SVT   She is on thyroid replacement with normal TSH. Toprol ordered March 04 2019 25 mg On statin for HLD   Cancer Rx with 6 cycles paclitaxel and carboplatin 2015 and radiation to right pelvis Poor balance with neuropathy from Rx. CT abdomen benign no metastatic dx 06/23/18  Echo reveiwed 02/14/19 EF 55-60% no significant valve disease  ECG SR PVC isolated normal QT rate 93   TSH 4 T4 free 1.5   She brought a copy of her monitor which I reviewed.  8% beats PAC;s Rare sVT longest only 6-8 beats  Discussed fact when she feels poorly for 30 or more minutes it would not be from arrhythmia lasting Seconds especially in light of her normal echo Monitor was done before her current dose of beta blocker  She lives alone has sister in [place] Denies anxiety Feels something is wrong with her  Episodes can have various prodromes and include tightness in her chest , fatigue and dyspnea  Heart Pathway Score:     Past Medical History:  Diagnosis Date  . Bowel obstruction ([place])    2015  . BRCA negative   . Breast cancer ([place])   . Cataract   . GERD (gastroesophageal reflux disease)   . Hypercholesterolemia   . Osteoporosis   . Peritoneal carcinoma ([place]) 2015  . Primary peritoneal carcinomatosis ([place])    presumed Stage 2C  . Thyroid disease   . Tubular  adenoma of colon     Past Surgical History:  Procedure Laterality Date  . abdominal laparoscopic surgery  2015   bowel obstruction  . BREAST LUMPECTOMY  2003   bilateral  . COLONOSCOPY    . MASTECTOMY Bilateral    [id number] -Multiple biopsies  . portcath     . TOTAL ABDOMINAL HYSTERECTOMY W/ BILATERAL SALPINGOOPHORECTOMY  2015  . UPPER GASTROINTESTINAL ENDOSCOPY    . wisdom teth extraction       Home Medications:  Prior to Admission medications   Medication Sig Start Date End Date Taking? Authorizing Provider  azelastine (ASTELIN) 0.1 % nasal spray Place into the nose 2 (two) times daily as needed.     [provider]  denosumab (PROLIA) 60 MG/ML SOLN injection Inject 60 mg into the skin every 6 (six) months. Administer in upper arm, thigh, or abdomen    [provider]  levothyroxine (SYNTHROID, LEVOTHROID) 25 MCG tablet Take 25 mcg by mouth daily.  02/18/17   [provider]  metoprolol succinate (TOPROL-XL) 25 MG 24 hr tablet Take 25 mg by mouth daily. 03/04/19   [provider]  Multiple Vitamin (MULTIVITAMIN) tablet Take 1 tablet by mouth daily.    [provider]  omeprazole (PRILOSEC) 20 MG capsule Take 20 mg by mouth every other day.    [provider]  polyethylene glycol (MIRALAX / GLYCOLAX) packet Take  by mouth 2 (two) times daily.     [provider]  simvastatin (ZOCOR) 20 MG tablet Take 20 mg by mouth at bedtime.    [provider]    Inpatient Medications: Scheduled Meds:  Continuous Infusions:  PRN Meds:   Allergies:    Allergies  Allergen Reactions  . Penicillins     Felt like "pins and needles" on skin  . Tramadol Dermatitis    Itching , cough   . Keflex [Cephalexin] Rash    Social History:   Social History   Socioeconomic History  . Marital status: Single    Spouse name: Not on file  . Number of children: Not on file  . Years of education: Not on file  . Highest education  level: Not on file  Occupational History  . Not on file  Social Needs  . Financial resource strain: Not on file  . Food insecurity    Worry: Not on file    Inability: Not on file  . Transportation needs    Medical: Not on file    Non-medical: Not on file  Tobacco Use  . Smoking status: Former Smoker    Quit date: 05/04/1978    Years since quitting: 40.9  . Smokeless tobacco: Never Used  Substance and Sexual Activity  . Alcohol use: No    Alcohol/week: 0.0 standard drinks  . Drug use: No  . Sexual activity: Not on file  Lifestyle  . Physical activity    Days per week: Not on file    Minutes per session: Not on file  . Stress: Not on file  Relationships  . Social Herbalist on phone: Not on file    Gets together: Not on file    Attends religious service: Not on file    Active member of club or organization: Not on file    Attends meetings of clubs or organizations: Not on file    Relationship status: Not on file  . Intimate partner violence    Fear of current or ex partner: Not on file    Emotionally abused: Not on file    Physically abused: Not on file    Forced sexual activity: Not on file  Other Topics Concern  . Not on file  Social History Narrative  . Not on file    Family History:    Family History  Problem Relation Age of Onset  . Colon cancer Father        ? late 37's early 64's.. Died at age 14.  . Skin cancer Father   . Stomach cancer Maternal Aunt   . Esophageal cancer Neg Hx   . Pancreatic cancer Neg Hx   . Prostate cancer Neg Hx   . Rectal cancer Neg Hx      ROS:  Please see the history of present illness.   All other ROS reviewed and negative.     Physical Exam/Data:  There were no vitals filed for this visit. '@IOBRIEF' @ Last 3 Weights 03/10/2019 06/23/2018 06/02/2018  Weight (lbs) 128 lb 9.6 oz 128 lb 4.8 oz 128 lb  Weight (kg) 58.333 kg 58.196 kg 58.06 kg     There is no height or weight on file to calculate BMI.  General:   Well nourished, well developed, in no acute distress  HEENT: normal Lymph: no adenopathy Neck: no JVD Endocrine:  No thryomegaly Vascular: No carotid bruits; FA pulses 2+ bilaterally without bruits  Cardiac:  normal S1,  S2; RRR; no murmur   Lungs:  clear to auscultation bilaterally, no wheezing, rhonchi or rales  Abd: soft, nontender, no hepatomegaly  Ext: no edema Musculoskeletal:  No deformities, BUE and BLE strength normal and equal Skin: warm and dry  Neuro:  CNs 2-12 intact, no focal abnormalities noted Psych:  Normal affect   EKG:  The EKG was personally reviewed and demonstrates:  Not done this visit   Relevant CV Studies: Monitor Dr Manuella Ghazi see HPI  Laboratory Data:  High Sensitivity Troponin:  No results for input(s): TROPONINIHS in the last 720 hours.   Cardiac EnzymesNo results for input(s): TROPONINI in the last 168 hours. No results for input(s): TROPIPOC in the last 168 hours.  ChemistryNo results for input(s): NA, K, CL, CO2, GLUCOSE, BUN, CREATININE, CALCIUM, GFRNONAA, GFRAA, ANIONGAP in the last 168 hours.  No results for input(s): PROT, ALBUMIN, AST, ALT, ALKPHOS, BILITOT in the last 168 hours. HematologyNo results for input(s): WBC, RBC, HGB, HCT, MCV, MCH, MCHC, RDW, PLT in the last 168 hours. BNPNo results for input(s): BNP, PROBNP in the last 168 hours.  DDimer No results for input(s): DDIMER in the last 168 hours.   Radiology/Studies:  No results found.  Assessment and Plan:   1. Palpitations/SVT:  Seems benign and symptoms disproportonate to any arrhythmia noted Continue current dose of beta blocker f/u stress myovue to r/o CAD since she has some tightness In her chest with it. May need another monitor where she activates more when she has prolonged symptoms 2. Cancer:  CA-125 and abdominal CT stable f/u oncology 3. Thyroid: on replacement given palpitations will check TSHT4   For questions or updates, please contact [place] Please consult  [email or web address] for contact info under     Signed, Jenkins Rouge, MD  03/20/2019 5:05 PM

## 2019-03-22 ENCOUNTER — Encounter: Payer: Self-pay | Admitting: Cardiovascular Disease

## 2019-03-22 ENCOUNTER — Encounter

## 2019-03-22 ENCOUNTER — Ambulatory Visit (INDEPENDENT_AMBULATORY_CARE_PROVIDER_SITE_OTHER): Payer: Medicare Other | Admitting: Cardiovascular Disease

## 2019-03-22 ENCOUNTER — Other Ambulatory Visit: Payer: Self-pay

## 2019-03-22 DIAGNOSIS — R079 Chest pain, unspecified: Secondary | ICD-10-CM | POA: Diagnosis not present

## 2019-03-22 NOTE — Patient Instructions (Signed)
Medication Instructions:  Your physician recommends that you continue on your current medications as directed. Please refer to the Current Medication list given to you today.   Labwork: NONE  Testing/Procedures: Your physician has requested that you have en exercise stress myoview. For further information please visit [email or web address]. Please follow instruction sheet, as given.    Follow-Up: Your physician recommends that you schedule a follow-up appointment in: 3  WEEKS IN [place]    Any Other Special Instructions Will Be Listed Below (If Applicable).     If you need a refill on your cardiac medications before your next appointment, please call your pharmacy.

## 2019-03-28 ENCOUNTER — Other Ambulatory Visit: Payer: Self-pay

## 2019-03-28 ENCOUNTER — Ambulatory Visit (HOSPITAL_COMMUNITY)
Admission: RE | Admit: 2019-03-28 | Discharge: 2019-03-28 | Disposition: A | Discharge status: Home / Self Care | Payer: Medicare Other | Source: Ambulatory Visit | Attending: Cardiovascular Disease | Admitting: Cardiovascular Disease

## 2019-03-28 ENCOUNTER — Encounter (HOSPITAL_COMMUNITY)
Admission: RE | Admit: 2019-03-28 | Discharge: 2019-03-28 | Disposition: A | Discharge status: Home / Self Care | Payer: Medicare Other | Source: Ambulatory Visit | Attending: Cardiovascular Disease | Admitting: Cardiovascular Disease

## 2019-03-28 ENCOUNTER — Encounter (HOSPITAL_COMMUNITY): Payer: Self-pay

## 2019-03-28 DIAGNOSIS — R079 Chest pain, unspecified: Secondary | ICD-10-CM | POA: Insufficient documentation

## 2019-03-28 LAB — NM MYOCAR MULTI W/SPECT W/WALL MOTION / EF
LV dias vol: 45 mL (ref 46–106)
LV sys vol: 12 mL
Peak HR: 118 {beats}/min
RATE: 0.35
Rest HR: 59 {beats}/min
SDS: 3
SRS: 0
SSS: 3
TID: 0.9

## 2019-03-28 MED ORDER — TECHNETIUM TC 99M TETROFOSMIN IV KIT
30.0000 | PACK | Freq: Once | INTRAVENOUS | Status: AC
  Administered 2019-03-28: 33 via INTRAVENOUS

## 2019-03-28 MED ORDER — TECHNETIUM TC 99M TETROFOSMIN IV KIT
10.0000 | PACK | Freq: Once | INTRAVENOUS | Status: AC | PRN
  Administered 2019-03-28: 9.5 via INTRAVENOUS

## 2019-03-28 MED ORDER — SODIUM CHLORIDE 0.9% FLUSH
INTRAVENOUS | Status: AC
  Administered 2019-03-28: 10 mL via INTRAVENOUS
  Filled 2019-03-28: qty 10

## 2019-03-28 MED ORDER — REGADENOSON 0.4 MG/5ML IV SOLN
INTRAVENOUS | Status: AC
  Administered 2019-03-28: 09:00:00 via INTRAVENOUS
  Filled 2019-03-28: qty 5

## 2019-04-07 ENCOUNTER — Ambulatory Visit: Payer: Medicare Other | Admitting: Cardiology

## 2019-04-14 ENCOUNTER — Other Ambulatory Visit: Payer: Self-pay | Admitting: *Deleted

## 2019-04-14 ENCOUNTER — Telehealth: Payer: Self-pay | Admitting: Nurse Practitioner

## 2019-04-14 NOTE — Telephone Encounter (Signed)
Patient's appointment rescheduled from 8/13 to 8/10 due to a change in Dr. Kyla Balzarine schedule. Patient verbalized consent for telephone visit.  YOUR CARDIOLOGY TEAM HAS ARRANGED FOR AN E-VISIT FOR YOUR APPOINTMENT - PLEASE REVIEW IMPORTANT INFORMATION BELOW SEVERAL DAYS PRIOR TO YOUR APPOINTMENT  Due to the recent COVID-19 pandemic, we are transitioning in-person office visits to tele-medicine visits in an effort to decrease unnecessary exposure to our patients, their families, and staff. These visits are billed to your insurance just like a normal visit is. We also encourage you to sign up for MyChart if you have not already done so. You will need a smartphone if possible. For patients that do not have this, we can still complete the visit using a regular telephone but do prefer a smartphone to enable video when possible. You may have a family member that lives with you that can help. If possible, we also ask that you have a blood pressure cuff and scale at home to measure your blood pressure, heart rate and weight prior to your scheduled appointment. Patients with clinical needs that need an in-person evaluation and testing will still be able to come to the office if absolutely necessary. If you have any questions, feel free to call our office.   YOUR PROVIDER WILL BE USING THE FOLLOWING PLATFORM TO COMPLETE YOUR VISIT: Telephone  2-3 DAYS BEFORE YOUR APPOINTMENT  You will receive a telephone call from one of our [facility] team members - your caller ID may say "Unknown caller." If this is a video visit, we will walk you through how to get the video launched on your phone. We will remind you check your blood pressure, heart rate and weight prior to your scheduled appointment. If you have an Apple Watch or Kardia, please upload any pertinent ECG strips the day before or morning of your appointment to [facility]. Our staff will also make sure you have reviewed the consent and agree to move forward with your  scheduled tele-health visit.    THE DAY OF YOUR APPOINTMENT  Approximately 15 minutes prior to your scheduled appointment, you will receive a telephone call from one of [facility] team - your caller ID may say "Unknown caller."  Our staff will confirm medications, vital signs for the day and any symptoms you may be experiencing. Please have this information available prior to the time of visit start. It may also be helpful for you to have a pad of paper and pen handy for any instructions given during your visit. They will also walk you through joining the smartphone meeting if this is a video visit.   CONSENT FOR TELE-HEALTH VISIT - PLEASE REVIEW  I hereby voluntarily request, consent and authorize [facility] and its employed or contracted physicians, physician assistants, nurse practitioners or other licensed health care professionals (the Practitioner), to provide me with telemedicine health care services (the "Services") as deemed necessary by the treating Practitioner. I acknowledge and consent to receive the Services by the Practitioner via telemedicine. I understand that the telemedicine visit will involve communicating with the Practitioner through live audiovisual communication technology and the disclosure of certain medical information by electronic transmission. I acknowledge that I have been given the opportunity to request an in-person assessment or other available alternative prior to the telemedicine visit and am voluntarily participating in the telemedicine visit.  I understand that I have the right to withhold or withdraw my consent to the use of telemedicine in the course of my care at any time, without  affecting my right to future care or treatment, and that the Practitioner or I may terminate the telemedicine visit at any time. I understand that I have the right to inspect all information obtained and/or recorded in the course of the telemedicine visit and may receive copies of  available information for a reasonable fee.  I understand that some of the potential risks of receiving the Services via telemedicine include:  Marland Kitchen Delay or interruption in medical evaluation due to technological equipment failure or disruption; . Information transmitted may not be sufficient (e.g. poor resolution of images) to allow for appropriate medical decision making by the Practitioner; and/or  . In rare instances, security protocols could fail, causing a breach of personal health information.  Furthermore, I acknowledge that it is my responsibility to provide information about my medical history, conditions and care that is complete and accurate to the best of my ability. I acknowledge that Practitioner's advice, recommendations, and/or decision may be based on factors not within their control, such as incomplete or inaccurate data provided by me or distortions of diagnostic images or specimens that may result from electronic transmissions. I understand that the practice of medicine is not an exact science and that Practitioner makes no warranties or guarantees regarding treatment outcomes. I acknowledge that I will receive a copy of this consent concurrently upon execution via email to the email address I last provided but may also request a printed copy by calling the office of [facility].    I understand that my insurance will be billed for this visit.   I have read or had this consent read to me. . I understand the contents of this consent, which adequately explains the benefits and risks of the Services being provided via telemedicine.  . I have been provided ample opportunity to ask questions regarding this consent and the Services and have had my questions answered to my satisfaction. . I give my informed consent for the services to be provided through the use of telemedicine in my medical care  By participating in this telemedicine visit I agree to the above.

## 2019-04-14 NOTE — Progress Notes (Signed)
 Virtual Visit via Telephone Note   This visit type was conducted due to national recommendations for restrictions regarding the COVID-19 Pandemic (e.g. social distancing) in an effort to limit this patient's exposure and mitigate transmission in our community.  Due to her co-morbid illnesses, this patient is at least at moderate risk for complications without adequate follow up.  This format is felt to be most appropriate for this patient at this time.  The patient did not have access to video technology/had technical difficulties with video requiring transitioning to audio format only (telephone).  All issues noted in this document were discussed and addressed.  No physical exam could be performed with this format.  Please refer to the patient's chart for her  consent to telehealth for [facility].   Date:  04/18/2019   ID:  Andrea Simpson, DOB 02/19/1946, MRN [id number]  Patient Location: Home Provider Location: Office  PCP:  Shah, Ashish, MD  Cardiologist:   Nishan Electrophysiologist:  None   Evaluation Performed:  Follow-Up Visit  Chief Complaint:  SVT  History of Present Illness:    73 y.o. first seen 03/22/19 for SVT. Referred by Dr Shah. History of breast cancer, primary peritoneal carcinomatosis, thyroid disease. Also stakes statin for HLD  Echo done 02/14/19 normal EF 55-60% no valve disease ECG showed SR with isolated PVC and normal QT rate 93  TSH/T4 have been normal Monitor ordered by primary reviewed and showed 8% PaC;s with rate SVT only 6-8 beats She was started on beta blockers.  She lives alone Has sister in [place] Has had multiple somatic complaints that did not correlate with arrhythmia on monitor and include tightness in chest, fatigue and dyspnea She subsequently had normal myovue done 03/28/19 with EF >65%   Still with episodes of throat tightness, fatigue and palpitations can last 20 minutes to 1.5 hours Suspect it may be anxiety related to the fact that she has  had cancer twice  Discussed adding PRN Inderal    The patient  does not have symptoms concerning for COVID-19 infection (fever, chills, cough, or new shortness of breath).    Past Medical History:  Diagnosis Date  . Bowel obstruction (HCC)    2015  . BRCA negative   . Breast cancer (HCC)   . Cataract   . GERD (gastroesophageal reflux disease)   . Hypercholesterolemia   . Osteoporosis   . Peritoneal carcinoma (HCC) 2015  . Primary peritoneal carcinomatosis (HCC)    presumed Stage 2C  . Thyroid disease   . Tubular adenoma of colon    Past Surgical History:  Procedure Laterality Date  . abdominal laparoscopic surgery  2015   bowel obstruction  . BREAST LUMPECTOMY  2003   bilateral  . COLONOSCOPY    . MASTECTOMY Bilateral    1967-2003 -Multiple biopsies  . portcath     . TOTAL ABDOMINAL HYSTERECTOMY W/ BILATERAL SALPINGOOPHORECTOMY  2015  . UPPER GASTROINTESTINAL ENDOSCOPY    . wisdom teth extraction       No outpatient medications have been marked as taking for the 04/18/19 encounter (Telemedicine) with Nishan, Peter C, MD.     Allergies:   Penicillins, Tramadol, and Keflex [cephalexin]   Social History   Tobacco Use  . Smoking status: Former Smoker    Quit date: 05/04/1978    Years since quitting: 40.9  . Smokeless tobacco: Never Used  Substance Use Topics  . Alcohol use: No    Alcohol/week: 0.0 standard drinks  . Drug   use: No     Family Hx: The patient's family history includes Colon cancer in her father; Skin cancer in her father; Stomach cancer in her maternal aunt. There is no history of Esophageal cancer, Pancreatic cancer, Prostate cancer, or Rectal cancer.  ROS:   Please see the history of present illness.     All other systems reviewed and are negative.   Prior CV studies:   The following studies were reviewed today:  Myovue July 2020 Echo/Monitor Primary June 2020  Labs/Other Tests and Data Reviewed:    EKG:  February 14 2019 SR isolated PVC  normal QT   Recent Labs: 06/21/2018: BUN 16; Creatinine, Ser 1.05; Potassium 4.2; Sodium 141   Recent Lipid Panel No results found for: CHOL, TRIG, HDL, CHOLHDL, LDLCALC, LDLDIRECT  Wt Readings from Last 3 Encounters:  04/18/19 129 lb (58.5 kg)  03/22/19 127 lb (57.6 kg)  03/10/19 128 lb 9.6 oz (58.3 kg)     Objective:    Vital Signs:  BP 110/65 (BP Location: Left Arm, Patient Position: Sitting, Cuff Size: Normal)   Pulse 65   Ht 5' 6.5" (1.689 m)   Wt 129 lb (58.5 kg)   BMI 20.51 kg/m    Telephone visit no exam   ASSESSMENT & PLAN:    1. Chest pain:  Atypical normal myovue 03/28/19 observe 2. Dyspnea:  Non cardiac echo 02/14/19 normal  3. Atrial Arrhythmias:  PaC;s and short bursts SVT continue beta blocker PRN Inderal added  4. Thyroid:  Continue current synthroid dose labs with primary  5. HLD:  On statin labs with primary   COVID-19 Education: The signs and symptoms of COVID-19 were discussed with the patient and how to seek care for testing (follow up with PCP or arrange E-visit).  The importance of social distancing was discussed today.  Time:   Today, I have spent 30 minutes with the patient with telehealth technology discussing the above problems.     Medication Adjustments/Labs and Tests Ordered: Current medicines are reviewed at length with the patient today.  Concerns regarding medicines are outlined above.   Tests Ordered:  None   Medication Changes:  PRN Inderal   Disposition:  Follow up in a year  Signed, Peter Nishan, MD  04/18/2019 8:11 AM    [facility]  

## 2019-04-18 ENCOUNTER — Telehealth (INDEPENDENT_AMBULATORY_CARE_PROVIDER_SITE_OTHER): Payer: Medicare Other | Admitting: Cardiovascular Disease

## 2019-04-18 ENCOUNTER — Other Ambulatory Visit: Payer: Self-pay

## 2019-04-18 ENCOUNTER — Encounter

## 2019-04-18 VITALS — BP 110/65 | HR 65 | Ht 66.5 in | Wt 129.0 lb

## 2019-04-18 DIAGNOSIS — E079 Disorder of thyroid, unspecified: Secondary | ICD-10-CM

## 2019-04-18 DIAGNOSIS — R002 Palpitations: Secondary | ICD-10-CM

## 2019-04-18 DIAGNOSIS — E785 Hyperlipidemia, unspecified: Secondary | ICD-10-CM

## 2019-04-18 DIAGNOSIS — R0600 Dyspnea, unspecified: Secondary | ICD-10-CM

## 2019-04-18 DIAGNOSIS — I499 Cardiac arrhythmia, unspecified: Secondary | ICD-10-CM | POA: Diagnosis not present

## 2019-04-18 DIAGNOSIS — R079 Chest pain, unspecified: Secondary | ICD-10-CM

## 2019-04-18 MED ORDER — PROPRANOLOL HCL 10 MG PO TABS: 10.0000 mg | ORAL_TABLET | Freq: Four times a day (QID) | ORAL | 11 refills | Status: DC | PRN

## 2019-04-18 NOTE — Addendum Note (Signed)
Addended by: Emmaline Life on: 04/18/2019 08:32 AM   Modules accepted: Orders

## 2019-04-18 NOTE — Patient Instructions (Signed)
Medication Instructions:  Your physician has recommended you make the following change in your medication:  START Propranolol (Inderal) 10 mg up to 4 times per day as needed for palpitations or fast heart rate  If you need a refill on your cardiac medications before your next appointment, please call your pharmacy.    Lab work: None Ordered    Testing/Procedures: None Ordered    Follow-Up: At [facility], you and your health needs are our priority.  As part of our continuing mission to provide you with exceptional heart care, we have created designated Provider Care Teams.  These Care Teams include your primary Cardiologist (physician) and Advanced Practice Providers (APPs -  Physician Assistants and Nurse Practitioners) who all work together to provide you with the care you need, when you need it. You will need a follow up appointment in 6 months.  Please call our office 2 months in advance to schedule this appointment.  You may see Dr. Johnsie Cancel or one of the following Advanced Practice Providers on your designated Care Team:   Truitt Merle, NP Cecilie Kicks, NP . Kathyrn Drown, NP

## 2019-04-21 ENCOUNTER — Ambulatory Visit: Payer: Medicare Other | Admitting: Cardiovascular Disease

## 2019-05-25 ENCOUNTER — Telehealth: Payer: Self-pay | Admitting: *Deleted

## 2019-05-25 NOTE — Telephone Encounter (Signed)
Called and left the patient a message to call the office back. Need to give the patient the information regarding her appts

## 2019-07-07 ENCOUNTER — Ambulatory Visit: Payer: Medicare Other | Admitting: Gynecologic Oncology

## 2019-07-07 ENCOUNTER — Encounter (HOSPITAL_COMMUNITY): Payer: Self-pay

## 2019-07-07 ENCOUNTER — Other Ambulatory Visit: Payer: Self-pay

## 2019-07-07 ENCOUNTER — Inpatient Hospital Stay: Payer: Medicare Other | Attending: Gynecologic Oncology

## 2019-07-07 ENCOUNTER — Inpatient Hospital Stay (HOSPITAL_BASED_OUTPATIENT_CLINIC_OR_DEPARTMENT_OTHER): Payer: Medicare Other | Admitting: Gynecologic Oncology

## 2019-07-07 ENCOUNTER — Ambulatory Visit (HOSPITAL_COMMUNITY)
Admission: RE | Admit: 2019-07-07 | Discharge: 2019-07-07 | Disposition: A | Discharge status: Home / Self Care | Payer: Medicare Other | Source: Ambulatory Visit | Attending: Gynecologic Oncology | Admitting: Gynecologic Oncology

## 2019-07-07 VITALS — BP 121/83 | HR 98 | Temp 98.0°F | Resp 16 | Ht 66.5 in | Wt 132.0 lb

## 2019-07-07 DIAGNOSIS — C482 Malignant neoplasm of peritoneum, unspecified: Secondary | ICD-10-CM

## 2019-07-07 DIAGNOSIS — Z90722 Acquired absence of ovaries, bilateral: Secondary | ICD-10-CM | POA: Diagnosis not present

## 2019-07-07 DIAGNOSIS — Z9221 Personal history of antineoplastic chemotherapy: Secondary | ICD-10-CM | POA: Insufficient documentation

## 2019-07-07 DIAGNOSIS — Z923 Personal history of irradiation: Secondary | ICD-10-CM | POA: Insufficient documentation

## 2019-07-07 DIAGNOSIS — Z86010 Personal history of colonic polyps: Secondary | ICD-10-CM | POA: Insufficient documentation

## 2019-07-07 DIAGNOSIS — K219 Gastro-esophageal reflux disease without esophagitis: Secondary | ICD-10-CM | POA: Diagnosis not present

## 2019-07-07 DIAGNOSIS — E7800 Pure hypercholesterolemia, unspecified: Secondary | ICD-10-CM | POA: Diagnosis not present

## 2019-07-07 DIAGNOSIS — Z79899 Other long term (current) drug therapy: Secondary | ICD-10-CM | POA: Insufficient documentation

## 2019-07-07 DIAGNOSIS — Z90710 Acquired absence of both cervix and uterus: Secondary | ICD-10-CM | POA: Insufficient documentation

## 2019-07-07 DIAGNOSIS — Z853 Personal history of malignant neoplasm of breast: Secondary | ICD-10-CM

## 2019-07-07 DIAGNOSIS — M810 Age-related osteoporosis without current pathological fracture: Secondary | ICD-10-CM | POA: Diagnosis not present

## 2019-07-07 DIAGNOSIS — E079 Disorder of thyroid, unspecified: Secondary | ICD-10-CM | POA: Insufficient documentation

## 2019-07-07 LAB — BASIC METABOLIC PANEL - CANCER CENTER ONLY
Anion gap: 9 (ref 5–15)
BUN: 21 mg/dL (ref 8–23)
CO2: 24 mmol/L (ref 22–32)
Calcium: 9.9 mg/dL (ref 8.9–10.3)
Chloride: 106 mmol/L (ref 98–111)
Creatinine: 1.1 mg/dL — ABNORMAL HIGH (ref 0.44–1.00)
GFR, Est AFR Am: 57 mL/min — ABNORMAL LOW (ref >60)
GFR, Estimated: 49 mL/min — ABNORMAL LOW (ref >60)
Glucose, Bld: 92 mg/dL (ref 70–99)
Potassium: 4.2 mmol/L (ref 3.5–5.1)
Sodium: 139 mmol/L (ref 135–145)

## 2019-07-07 MED ORDER — SODIUM CHLORIDE (PF) 0.9 % IJ SOLN
INTRAMUSCULAR | Status: AC
  Filled 2019-07-07: qty 50

## 2019-07-07 MED ORDER — IOHEXOL 300 MG/ML  SOLN
100.0000 mL | Freq: Once | INTRAMUSCULAR | Status: AC | PRN
  Administered 2019-07-07: 100 mL via INTRAVENOUS

## 2019-07-07 NOTE — Patient Instructions (Signed)
Please follow-up with your primary care clinician regarding atherosclerosis  Follow-up with Gyn Onc in 09/2019   Thank you very much Andrea Simpson for allowing me to provide care for you today.  I appreciate your confidence in choosing our Gynecologic Oncology team.  If you have any questions about your visit today please call our office and we will get back to you as soon as possible.  Please consider using the website Medlineplus.gov as an Geneticist, molecular.   Francetta Found. Ileta Ofarrell MD., PhD Gynecologic Oncology   Atherosclerosis  Atherosclerosis is narrowing and hardening of the arteries. Arteries are blood vessels that carry blood from the heart to all parts of the body. This blood contains oxygen. Arteries can become narrow or clogged with a buildup of fat, cholesterol, calcium, and other substances (plaque). Plaque decreases the amount of blood that can flow through the artery. Atherosclerosis can affect any artery in the body, including:  Heart arteries (coronary artery disease). This may cause a heart attack.  Brain arteries. This may cause a stroke (cerebrovascular accident).  Leg, arm, and pelvis arteries (peripheral artery disease). This may cause pain and numbness.  Kidney arteries. This may cause kidney (renal) failure. Treatment may slow the disease and prevent further damage to the heart, brain, peripheral arteries, and kidneys. What are the causes? Atherosclerosis develops slowly over many years. The inner layers of your arteries become damaged and allow the gradual buildup of plaque. The exact cause of atherosclerosis is not fully understood. Symptoms of atherosclerosis do not occur until the artery becomes narrow or blocked. What increases the risk? The following factors may make you more likely to develop this condition:  High blood pressure.  High cholesterol.  Being middle-aged or older.  Having a family history of atherosclerosis.  Having high blood fats  (triglycerides).  Diabetes.  Being overweight.  Smoking tobacco.  Not exercising enough (sedentary lifestyle).  Having a substance in the blood called C-reactive protein (CRP). This is a sign of increased levels of inflammation in the body.  Sleep apnea.  Being stressed.  Drinking too much alcohol. What are the signs or symptoms? This condition may not cause any symptoms. If you have symptoms, they are caused by damage to an area of your body that is not getting enough blood.  Coronary artery disease may cause chest pain and shortness of breath.  Decreased blood supply to your brain may cause a stroke. Signs of a stroke may include sudden: ? Weakness on one side of the body. ? Confusion. ? Changes in vision. ? Inability to speak or understand speech. ? Loss of balance, coordination, or the ability to walk. ? Severe headache. ? Loss of consciousness.  Peripheral arterial disease may cause pain and numbness, often in the legs and hips.  Renal failure may cause fatigue, nausea, swelling, and itchy skin. How is this diagnosed? This condition is diagnosed based on your medical history and a physical exam. During the exam:  Your health care provider will: ? Check your pulse in different places. ? Listen for a "whooshing" sound over your arteries (bruit).  You may have tests, such as: ? Blood tests to check your levels of cholesterol, triglycerides, and CRP. ? Electrocardiogram (ECG) to check for heart damage. ? Chest X-ray to see if you have an enlarged heart, which is a sign of heart failure. ? Stress test to see how your heart reacts to exercise. ? Echocardiogram to get images of the inside of your heart. ?  Ankle-brachial index to compare blood pressure in your arms to blood pressure in your ankles. ? Ultrasound of your peripheral arteries to check blood flow. ? CT scan to check for damage to your heart or brain. ? X-rays of blood vessels after dye has been injected  (angiogram) to check blood flow. How is this treated? Treatment starts with lifestyle changes, which may include:  Changing your diet.  Losing weight.  Reducing stress.  Exercising and being physically active more regularly.  Not smoking. You may also need medicine to:  Lower triglycerides and cholesterol.  Control blood pressure.  Prevent blood clots.  Lower inflammation in your body.  Control your blood sugar. Sometimes, surgery is needed to:  Remove plaque from an artery (endarterectomy).  Open or widen a narrowed heart artery (angioplasty).  Create a new path for your blood with one of these procedures: ? Heart (coronary) artery bypass graft surgery. ? Peripheral artery bypass graft surgery. Follow these instructions at home: Eating and drinking   Eat a heart-healthy diet. Talk with your health care provider or a diet and nutrition specialist (dietitian) if you need help. A heart-healthy diet involves: ? Limiting unhealthy fats and increasing healthy fats. Some examples of healthy fats are olive oil and canola oil. ? Eating plant-based foods, such as fruits, vegetables, nuts, whole grains, and legumes (such as peas and lentils).  Limit alcohol intake to no more than 1 drink a day for nonpregnant women and 2 drinks a day for men. One drink equals 12 oz of beer, 5 oz of wine, or 1 oz of hard liquor. Lifestyle  Follow an exercise program as told by your health care provider.  Maintain a healthy weight. Lose weight if your health care provider says that you need to do that.  Rest when you are tired.  Learn to manage your stress.  Do not use any products that contain nicotine or tobacco, such as cigarettes and e-cigarettes. If you need help quitting, ask your health care provider.  Do not abuse drugs. General instructions  Take over-the-counter and prescription medicines only as told by your health care provider.  Manage other health conditions as told by  your health care provider.  Keep all follow-up visits as told by your health care provider. This is important. Contact a health care provider if:  You have chest pain or discomfort. This includes squeezing chest pain that may feel like indigestion (angina).  You have shortness of breath.  You have an irregular heartbeat.  You have unexplained fatigue.  You have unexplained pain or numbness in an arm, leg, or hip.  You have nausea, swelling of your hands or feet, and itchy skin. Get help right away if:  You have any symptoms of a heart attack, such as: ? Chest pain. ? Shortness of breath. ? Pain in your neck, jaw, arms, back, or stomach. ? Cold sweat. ? Nausea. ? Light-headedness.  You have any symptoms of a stroke. "BE FAST" is an easy way to remember the main warning signs of a stroke: ? B - Balance. Signs are dizziness, sudden trouble walking, or loss of balance. ? E - Eyes. Signs are trouble seeing or a sudden change in vision. ? F - Face. Signs are sudden weakness or numbness of the face, or the face or eyelid drooping on one side. ? A - Arms. Signs are weakness or numbness in an arm. This happens suddenly and usually on one side of the body. ? S - Speech.  Signs are sudden trouble speaking, slurred speech, or trouble understanding what people say. ? T - Time. Time to call emergency services. Write down what time symptoms started.  You have other signs of a stroke, such as: ? A sudden, severe headache with no known cause. ? Nausea or vomiting. ? Seizure. These symptoms may represent a serious problem that is an emergency. Do not wait to see if the symptoms will go away. Get medical help right away. Call your local emergency services (911 in the [place] Do not drive yourself to the hospital. Summary  Atherosclerosis is narrowing and hardening of the arteries.  Arteries can become narrow or clogged with a buildup of fat, cholesterol, calcium, and other substances (plaque).   This condition may not cause any symptoms. If you do have symptoms, they are caused by damage to an area of your body that is not getting enough blood.  Treatment may include lifestyle changes and medicines. In some cases, surgery is needed. This information is not intended to replace advice given to you by your health care provider. Make sure you discuss any questions you have with your health care provider. Document Released: 11/15/2003 Document Revised: 12/04/2017 Document Reviewed: 04/30/2017 Elsevier Patient Education  2020 [employer].

## 2019-07-07 NOTE — Progress Notes (Signed)
GYN ONCOLOGY OFFICE VISIT   CC:  Chief Complaint  Patient presents with  . Primary peritoneal carcinomatosis ([place])   HISTORY OF PRESENT ILLNESS  Oncology History  Primary peritoneal carcinomatosis ([place])  01/10/2014 Initial Diagnosis   Primary peritoneal carcinomatosis ([place]), IIC   03/27/2014 - 07/10/2014 Chemotherapy   Status post 6 cycles of paclitaxel and carboplatin.    08/15/2014 - 09/26/2014 Radiation Therapy   5040 cGy in 28 fractions to the right pelvis.   07/07/2019 Imaging   CT C/A/P   IMPRESSION: 1. Unremarkable and stable CT appearance of the chest, abdomen and pelvis. No findings for recurrent peritoneal carcinomatosis. 2. No acute abdominal/pelvic findings. 3. Stable mild fusiform enlargement of the ascending aorta.            1. Reportedly BRCA negative pending genetic results  HPI: Patient is a 73 y.o. gravida 0    Interval History:  She was last here 6 months ago. Since that time she has noticed some change in her balance she attributes to neuropathy. Also some joint pain. She is a bit concerned about her belly and weight distribution being different than it was before treatment.  CA125 collected this am   Presenting History: She was diagnosed with a pelvic mass and an elevated CA-125 (60s per patient report) in May 2015. On 01/10/2014 she underwent a TAH/BSO with her primary Gyn. Findings included what appeared to be bilateral normal ovaries with an "inflammatory mass" involving the back of the uterus, the right round ligament and towards the right pelvic sidewall. The mass was apparently sent for frozen section was felt to be benign. Final pathology however revealed an adenocarcinoma with implants and soft tissue posterior to the uterus. Per the pathology report, the adnexa and fallopian tubes were negative. The tissue posterior to the uterus was a clear cell carcinoma. The tumor was ER negative, CK 7 positive. This was felt to be most consistent with a  clear cell carcinoma.   Per the patient's report, she then developed a partial small bowel obstruction and went back to the operating room on May 13. At that time omentum was excised that was benign. The patient was treated (in [place] with a [employer]) with 6 cycles of paclitaxel and carboplatin based chemotherapy which she completed from 03/27/2014 to 07/10/2014. She then underwent right pelvic radiation (with Dr. Sondra Come) from 08/15/2014 to 09/26/2014.  She's been free of disease since that time.   She's had CA-125 performed on a regular basis. CA-125 "June 27" was 10.1. She's also been undergoing annual CT scans of the abdomen and pelvis in November of every year. She had not been having routine pelvic exams. She did have genetic testing and per her report she is "BRCA negative". The patient does have a personal history of breast cancer back in 2013. She underwent a right partial mastectomy which revealed invasive ductal carcinoma measuring 1.5 cm. The tumor was grade 3 with 2 negative sentinel lymph nodes. Tumor was ER positive PR negative HER-2 positive. Ki-67 marker was 20%. The patient declined chemotherapy and targeted therapy. She did undergo radiation to the right breast with 5040 cGy. She then took Arimidex for 5 years which she completed in October 2008.  Measurement of disease: CA125   preop reported to be in the "60's"  02/23/17 - 13  12/30/2017 - 11.3  06/21/18 - 10.4  Recent Labs    03/10/19 1038  CAN125 11.3     Radiology:  CT11/14/2016 revealed no evidence  of peritoneal lesions or ascites  CT 06/26/17: IMPRESSION: Stable exam. No evidence of recurrent or metastatic disease in the abdomen or pelvis. Ct Chest W Contrast  Result Date: 07/07/2019 CLINICAL DATA:  Restaging peritoneal carcinomatosis. EXAM: CT CHEST, ABDOMEN, AND PELVIS WITH CONTRAST TECHNIQUE: Multidetector CT imaging of the chest, abdomen and pelvis was performed following the standard protocol  during bolus administration of intravenous contrast. CONTRAST:  153m OMNIPAQUE IOHEXOL 300 MG/ML  SOLN COMPARISON:  06/23/2018 FINDINGS: CT CHEST FINDINGS Cardiovascular: The heart is normal in size. No pericardial effusion. There is mild tortuosity and ectasia of the thoracic aorta with a few scattered calcifications. Stable scattered coronary artery calcifications. Mediastinum/Nodes: No mediastinal or hilar mass or adenopathy. The esophagus is grossly normal. Lungs/Pleura: Biapical pleural and parenchymal scarring changes. No acute pulmonary findings or worrisome pulmonary lesions. Mild scarring type changes involving the anterior aspect of the right lung likely due to radiation treatments of the right breast. No pleural effusions or pleural nodules. Musculoskeletal: No breast masses, supraclavicular or axillary adenopathy. The thyroid gland appears normal. The bony thorax is intact.  No worrisome bone lesions. CT ABDOMEN PELVIS FINDINGS Hepatobiliary: No focal hepatic lesions or evidence of peritoneal surface disease. The gallbladder is normal. Mild stable common bile duct dilatation. Pancreas: No mass, inflammation or ductal dilatation. Spleen: Normal size.  No focal lesions. Adrenals/Urinary Tract: Adrenal the adrenal glands and kidneys are unremarkable. No renal, ureteral or bladder calculi or mass. Stomach/Bowel: The stomach, duodenum, small bowel and colon are unremarkable. No acute inflammatory changes, mass lesions or obstructive findings. Low lying cecum deep in the pelvis. The terminal ileum is normal. Vascular/Lymphatic: Scattered atherosclerotic calcifications involving the aorta but no aneurysm or dissection. The branch vessels are patent. The major venous structures are patent. Reproductive: Surgically absent. Other: No pelvic mass or adenopathy. No free pelvic fluid collections. No findings for omental or peritoneal implants. Musculoskeletal: No significant bony findings. IMPRESSION: 1.  Unremarkable and stable CT appearance of the chest, abdomen and pelvis. No findings for recurrent peritoneal carcinomatosis. 2. No acute abdominal/pelvic findings. 3. Stable mild fusiform enlargement of the ascending aorta. Electronically Signed   By: PMarijo SanesM.D.   On: 07/07/2019 11:38   Ct Abdomen Pelvis W Contrast  Result Date: 07/07/2019 CLINICAL DATA:  Restaging peritoneal carcinomatosis. EXAM: CT CHEST, ABDOMEN, AND PELVIS WITH CONTRAST TECHNIQUE: Multidetector CT imaging of the chest, abdomen and pelvis was performed following the standard protocol during bolus administration of intravenous contrast. CONTRAST:  1057mOMNIPAQUE IOHEXOL 300 MG/ML  SOLN COMPARISON:  06/23/2018 FINDINGS: CT CHEST FINDINGS Cardiovascular: The heart is normal in size. No pericardial effusion. There is mild tortuosity and ectasia of the thoracic aorta with a few scattered calcifications. Stable scattered coronary artery calcifications. Mediastinum/Nodes: No mediastinal or hilar mass or adenopathy. The esophagus is grossly normal. Lungs/Pleura: Biapical pleural and parenchymal scarring changes. No acute pulmonary findings or worrisome pulmonary lesions. Mild scarring type changes involving the anterior aspect of the right lung likely due to radiation treatments of the right breast. No pleural effusions or pleural nodules. Musculoskeletal: No breast masses, supraclavicular or axillary adenopathy. The thyroid gland appears normal. The bony thorax is intact.  No worrisome bone lesions. CT ABDOMEN PELVIS FINDINGS Hepatobiliary: No focal hepatic lesions or evidence of peritoneal surface disease. The gallbladder is normal. Mild stable common bile duct dilatation. Pancreas: No mass, inflammation or ductal dilatation. Spleen: Normal size.  No focal lesions. Adrenals/Urinary Tract: Adrenal the adrenal glands and kidneys are unremarkable. No renal, ureteral  or bladder calculi or mass. Stomach/Bowel: The stomach, duodenum, small  bowel and colon are unremarkable. No acute inflammatory changes, mass lesions or obstructive findings. Low lying cecum deep in the pelvis. The terminal ileum is normal. Vascular/Lymphatic: Scattered atherosclerotic calcifications involving the aorta but no aneurysm or dissection. The branch vessels are patent. The major venous structures are patent. Reproductive: Surgically absent. Other: No pelvic mass or adenopathy. No free pelvic fluid collections. No findings for omental or peritoneal implants. Musculoskeletal: No significant bony findings. IMPRESSION: 1. Unremarkable and stable CT appearance of the chest, abdomen and pelvis. No findings for recurrent peritoneal carcinomatosis. 2. No acute abdominal/pelvic findings. 3. Stable mild fusiform enlargement of the ascending aorta. Electronically Signed   By: Marijo Sanes M.D.   On: 07/07/2019 11:38        Current Meds:  Outpatient Encounter Medications as of 07/07/2019  Medication Sig  . azelastine (ASTELIN) 0.1 % nasal spray Place into the nose 2 (two) times daily as needed.   Marland Kitchen denosumab (PROLIA) 60 MG/ML SOLN injection Inject 60 mg into the skin every 6 (six) months. Administer in upper arm, thigh, or abdomen  . levothyroxine (SYNTHROID, LEVOTHROID) 25 MCG tablet Take 25 mcg by mouth daily.   . metoprolol succinate (TOPROL-XL) 25 MG 24 hr tablet Take 25 mg by mouth daily.  . Multiple Vitamin (MULTIVITAMIN) tablet Take 1 tablet by mouth daily.  Marland Kitchen omeprazole (PRILOSEC) 20 MG capsule Take 20 mg by mouth daily.  . polyethylene glycol (MIRALAX / GLYCOLAX) packet Take by mouth 2 (two) times daily.   . simvastatin (ZOCOR) 20 MG tablet Take 20 mg by mouth at bedtime.  . propranolol (INDERAL) 10 MG tablet Take 1 tablet (10 mg total) by mouth 4 (four) times daily as needed (palpitations/fast heart rate). (Patient not taking: Reported on 07/07/2019)   Facility-Administered Encounter Medications as of 07/07/2019  Medication  . sodium chloride (PF) 0.9 %  injection    Allergy:  Allergies  Allergen Reactions  . Penicillins     Felt like "pins and needles" on skin  . Tramadol Dermatitis    Itching , cough   . Keflex [Cephalexin] Rash    Social Hx:   Social History   Socioeconomic History  . Marital status: Single    Spouse name: Not on file  . Number of children: Not on file  . Years of education: Not on file  . Highest education level: Not on file  Occupational History  . Not on file  Social Needs  . Financial resource strain: Not on file  . Food insecurity    Worry: Not on file    Inability: Not on file  . Transportation needs    Medical: Not on file    Non-medical: Not on file  Tobacco Use  . Smoking status: Former Smoker    Quit date: 05/04/1978    Years since quitting: 41.2  . Smokeless tobacco: Never Used  Substance and Sexual Activity  . Alcohol use: No    Alcohol/week: 0.0 standard drinks  . Drug use: No  . Sexual activity: Not on file  Lifestyle  . Physical activity    Days per week: Not on file    Minutes per session: Not on file  . Stress: Not on file  Relationships  . Social Herbalist on phone: Not on file    Gets together: Not on file    Attends religious service: Not on file    Active member of  club or organization: Not on file    Attends meetings of clubs or organizations: Not on file    Relationship status: Not on file  . Intimate partner violence    Fear of current or ex partner: Not on file    Emotionally abused: Not on file    Physically abused: Not on file    Forced sexual activity: Not on file  Other Topics Concern  . Not on file  Social History Narrative  . Not on file    Past Surgical Hx:  Past Surgical History:  Procedure Laterality Date  . abdominal laparoscopic surgery  2015   bowel obstruction  . BREAST LUMPECTOMY  2003   bilateral  . COLONOSCOPY    . MASTECTOMY Bilateral    [id number] -Multiple biopsies  . portcath     . TOTAL ABDOMINAL HYSTERECTOMY W/  BILATERAL SALPINGOOPHORECTOMY  2015  . UPPER GASTROINTESTINAL ENDOSCOPY    . wisdom teth extraction      Past Medical Hx:  Past Medical History:  Diagnosis Date  . Bowel obstruction ([place])    2015  . BRCA negative   . Breast cancer ([place])   . Cataract   . GERD (gastroesophageal reflux disease)   . Hypercholesterolemia   . Osteoporosis   . Peritoneal carcinoma ([place]) 2015  . Primary peritoneal carcinomatosis ([place])    presumed Stage 2C  . Thyroid disease   . Tubular adenoma of colon     Oncology Hx:  Oncology History  Primary peritoneal carcinomatosis ([place])  01/10/2014 Initial Diagnosis   Primary peritoneal carcinomatosis ([place]), IIC   03/27/2014 - 07/10/2014 Chemotherapy   Status post 6 cycles of paclitaxel and carboplatin.    08/15/2014 - 09/26/2014 Radiation Therapy   5040 cGy in 28 fractions to the right pelvis.     Family Hx:  Family History  Problem Relation Age of Onset  . Colon cancer Father        ? late 74's early 57's.. Died at age 13.  . Skin cancer Father   . Stomach cancer Maternal Aunt   . Esophageal cancer Neg Hx   . Pancreatic cancer Neg Hx   . Prostate cancer Neg Hx   . Rectal cancer Neg Hx    Review of Systems  Constitutional: Negative.  Negative for appetite change, chills, diaphoresis, fatigue and fever.  HENT:  Negative.   Eyes: Negative.   Cardiovascular: Negative.   Gastrointestinal: Negative.  Negative for abdominal distention, abdominal pain, blood in stool, rectal pain and vomiting.  Endocrine: Negative.   Genitourinary: Negative.  Negative for difficulty urinating, pelvic pain, vaginal bleeding and vaginal discharge.   Musculoskeletal: Negative for arthralgias.       Reports an increase in lower abdominal wall adipose mass  Skin: Negative.        Reports hair loss  Hematological: Negative.   Psychiatric/Behavioral: Negative.     Vitals:  Blood pressure 121/83, pulse 98, temperature 98 F (36.7 C), temperature source Temporal, resp.  rate 16, height 5' 6.5" (1.689 m), weight 132 lb (59.9 kg), SpO2 99 %. Body mass index is 20.99 kg/m.  Physical Exam: ECOG PERFORMANCE STATUS: 0 - Asymptomatic General :  Well developed, 73 y.o. female in no apparent distress HEENT:  Normocephalic/atraumatic, symmetric, EOMI, eyelids normal Lymphatics:  No cervical/ submandibular/ supraclavicular/ infraclavicular/ inguinal adenopathy Respiratory:  Respirations unlabored, no use of accessory muscles CV:   Regular rate and rhythm musculoskeletal: No CVA tenderness, normal muscle strength. Abdomen:  Soft, non-tender and nondistended.  No evidence of hernia. No masses. Extremities:  No lymphedema, no erythema, non-tender. Skin:   Normal inspection Neuro/Psych:  No focal motor deficit, no abnormal mental status. Normal gait. Normal affect. Alert and oriented to person, place, and time  Genito Urinary: Declined Rectal Declined  Assessment/Plan: History of both breast cancer as well as a presumed stage IIc primary peritoneal carcinoma diagnosed in 01/2014, completing treatment (chemo and RT) 09/2014.  She's been free of disease based primarily on CA-125's and CT scans.  1. Surveillance plan  CA 125 collected today, results pending  Given her radiation and unusual presentation Dr/ Gerarda Fraction recommended follow-up  beyond 5 years NED.  We discussed annual evaluation after January 2021.  There is significant pelvic discomfort exist associated with evaluations  CT October 2020 without evidence of recurrence atherosclerosis identified F/u January 2021   2.  Atherosclerosis Cardiac and aortic atherosclerosis noted on imaging. I advised Alexis Frock to follow-up with her PCP  3.  Hair loss Has been evaluated by dermatology but Alexis Frock did not find the dermatologic assessment entirely satisfactory.  I suggested that she either follow-up with that dermatologist or that we can facilitate a second opinion with another.    Cc: Monico Blitz,  MD

## 2019-07-08 LAB — CA 125: Cancer Antigen (CA) 125: 13.1 U/mL (ref 0.0–38.1)

## 2019-07-11 ENCOUNTER — Telehealth: Payer: Self-pay

## 2019-07-11 NOTE — Telephone Encounter (Signed)
Told Ms Hobaugh that her CA-125  was good at 13.1 per Joylene John, NP.

## 2019-08-28 ENCOUNTER — Other Ambulatory Visit: Payer: Self-pay | Admitting: Internal Medicine

## 2019-11-02 NOTE — Progress Notes (Signed)
Date:  11/07/2019   ID:  Andrea Simpson, DOB [birth year], MRN [id number]  PCP:  Monico Blitz, MD  Cardiologist:   Johnsie Cancel Electrophysiologist:  None   Evaluation Performed:  Follow-Up Visit  Chief Complaint:  SVT  History of Present Illness:    74 y.o. first seen 03/22/19 for SVT. Referred by Dr Manuella Ghazi. History of breast cancer, primary peritoneal carcinomatosis, thyroid disease. Also stakes statin for HLD  Echo done 02/14/19 normal EF 55-60% no valve disease ECG showed SR with isolated PVC and normal QT rate 93  TSH/T4 have been normal Monitor ordered by primary reviewed and showed 8% PaC;s with rare  SVT only 6-8 beats She was started on beta blockers.  She lives alone Has sister in [place] Has had multiple somatic complaints that did not correlate with arrhythmia on monitor and include tightness in chest, fatigue and dyspnea She subsequently had normal myovue done 03/28/19 with EF >65%   Still with episodes of throat tightness, fatigue and palpitations can last 20 minutes to 1.5 hours Suspect it may be anxiety related to the fact that she has had cancer twice  Not having to use PRN Inderal  Wants to have primary follow CT and CA125   The patient  does not have symptoms concerning for COVID-19 infection (fever, chills, cough, or new shortness of breath).    Past Medical History:  Diagnosis Date  . Bowel obstruction ([place])    2015  . BRCA negative   . Breast cancer ([place])   . Cataract   . GERD (gastroesophageal reflux disease)   . Hypercholesterolemia   . Osteoporosis   . Peritoneal carcinoma ([place]) 2015  . Primary peritoneal carcinomatosis ([place])    presumed Stage 2C  . Thyroid disease   . Tubular adenoma of colon    Past Surgical History:  Procedure Laterality Date  . abdominal laparoscopic surgery  2015   bowel obstruction  . BREAST LUMPECTOMY  2003   bilateral  . COLONOSCOPY    . MASTECTOMY Bilateral    [id number] -Multiple biopsies  . portcath     . TOTAL ABDOMINAL  HYSTERECTOMY W/ BILATERAL SALPINGOOPHORECTOMY  2015  . UPPER GASTROINTESTINAL ENDOSCOPY    . wisdom teth extraction       Current Meds  Medication Sig  . azelastine (ASTELIN) 0.1 % nasal spray Place into the nose 2 (two) times daily as needed.   Marland Kitchen denosumab (PROLIA) 60 MG/ML SOLN injection Inject 60 mg into the skin every 6 (six) months. Administer in upper arm, thigh, or abdomen  . levothyroxine (SYNTHROID, LEVOTHROID) 25 MCG tablet Take 25 mcg by mouth daily.   . metoprolol succinate (TOPROL-XL) 25 MG 24 hr tablet Take 25 mg by mouth daily.  . Multiple Vitamin (MULTIVITAMIN) tablet Take 1 tablet by mouth daily.  Marland Kitchen omeprazole (PRILOSEC) 20 MG capsule Take 20 mg by mouth daily.  . polyethylene glycol (MIRALAX / GLYCOLAX) packet Take by mouth 2 (two) times daily.   . simvastatin (ZOCOR) 20 MG tablet Take 20 mg by mouth at bedtime.     Allergies:   Penicillins, Tramadol, and Keflex [cephalexin]   Social History   Tobacco Use  . Smoking status: Former Smoker    Quit date: 05/04/1978    Years since quitting: 41.5  . Smokeless tobacco: Never Used  Substance Use Topics  . Alcohol use: No    Alcohol/week: 0.0 standard drinks  . Drug use: No     Family Hx: The patient's family history  includes Colon cancer in her father; Skin cancer in her father; Stomach cancer in her maternal aunt. There is no history of Esophageal cancer, Pancreatic cancer, Prostate cancer, or Rectal cancer.  ROS:   Please see the history of present illness.     All other systems reviewed and are negative.   Prior CV studies:   The following studies were reviewed today:  Myovue July 2020 Echo/Monitor Primary June 2020  Labs/Other Tests and Data Reviewed:    EKG:  February 14 2019 SR isolated PVC normal QT   Recent Labs: 07/07/2019: BUN 21; Creatinine 1.10; Potassium 4.2; Sodium 139   Recent Lipid Panel No results found for: CHOL, TRIG, HDL, CHOLHDL, LDLCALC, LDLDIRECT  Wt Readings from Last 3 Encounters:   11/07/19 127 lb (57.6 kg)  07/07/19 132 lb (59.9 kg)  04/18/19 129 lb (58.5 kg)     Objective:    Vital Signs:  BP 130/72   Pulse 92   Ht 5' 6.5" (1.689 m)   Wt 127 lb (57.6 kg)   SpO2 98%   BMI 20.19 kg/m    Affect appropriate Healthy:  appears stated age HEENT: normal Neck supple with no adenopathy JVP normal no bruits no thyromegaly Lungs clear with no wheezing and good diaphragmatic motion Heart:  S1/S2 no murmur, no rub, gallop or click PMI normal Abdomen: benighn, BS positve, no tenderness, no AAA no bruit.  No HSM or HJR Distal pulses intact with no bruits No edema Neuro non-focal Skin warm and dry No muscular weakness   ASSESSMENT & PLAN:    1. Chest pain:  Atypical normal myovue 03/28/19 observe 2. Dyspnea:  Non cardiac echo 02/14/19 normal  3. Atrial Arrhythmias:  PaC;s and short bursts SVT continue beta blocker PRN Inderal added  4. Thyroid:  Continue current synthroid dose labs with primary  5. HLD:  On statin labs with primary  6. Primary Peritoneal Carcinomatosis:  Post chemo/XRT CT 07/07/19 stable f/u oncology   COVID-19 Education: The signs and symptoms of COVID-19 were discussed with the patient and how to seek care for testing (follow up with PCP or arrange E-visit).  The importance of social distancing was discussed today.     Medication Adjustments/Labs and Tests Ordered: Current medicines are reviewed at length with the patient today.  Concerns regarding medicines are outlined above.   Tests Ordered:  None   Medication Changes:  None   Disposition:  Follow up in a year  Signed, Jenkins Rouge, MD  11/07/2019 11:15 AM    [place]

## 2019-11-07 ENCOUNTER — Other Ambulatory Visit: Payer: Self-pay

## 2019-11-07 ENCOUNTER — Ambulatory Visit: Payer: Medicare PPO | Admitting: Cardiovascular Disease

## 2019-11-07 ENCOUNTER — Encounter: Payer: Self-pay | Admitting: Cardiovascular Disease

## 2019-11-07 VITALS — BP 130/72 | HR 92 | Ht 66.5 in | Wt 127.0 lb

## 2019-11-07 DIAGNOSIS — I491 Atrial premature depolarization: Secondary | ICD-10-CM

## 2019-11-07 NOTE — Patient Instructions (Addendum)

## 2020-01-03 DIAGNOSIS — C482 Malignant neoplasm of peritoneum, unspecified: Secondary | ICD-10-CM | POA: Diagnosis not present

## 2020-02-02 DIAGNOSIS — M810 Age-related osteoporosis without current pathological fracture: Secondary | ICD-10-CM | POA: Diagnosis not present

## 2020-02-05 IMAGING — CT CT CHEST W/ CM
2 of 5 series · 12 of 36 positions shown, 15 images · IV contrast (OMNIPAQUE)
Comparison: 06/23/2018

CLINICAL DATA: Restaging peritoneal carcinomatosis.

EXAM:
CT CHEST, ABDOMEN, AND PELVIS WITH CONTRAST
TECHNIQUE: Multidetector CT imaging of the chest, abdomen and pelvis was
performed following the standard protocol during bolus
administration of intravenous contrast.
CONTRAST:  100mL OMNIPAQUE IOHEXOL 300 MG/ML  SOLN

[Series 2: cap with · axial · 0.76mm/px · z∈[-424,+56]mm · 9 of 122 slices shown, 12 images]
[im 13/122  mediastinal]
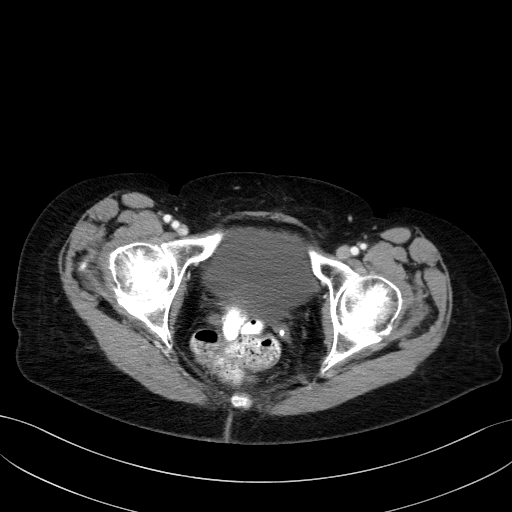
[im 13/122  lung]
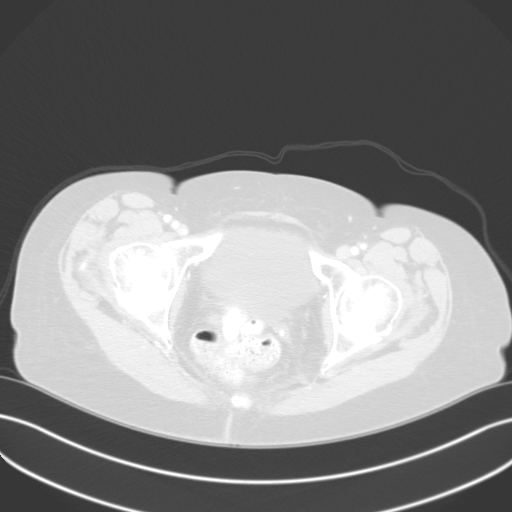
[im 25/122  lung]
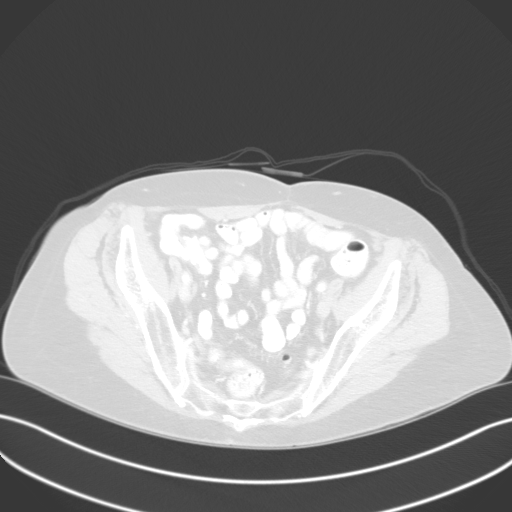
[im 37/122  lung]
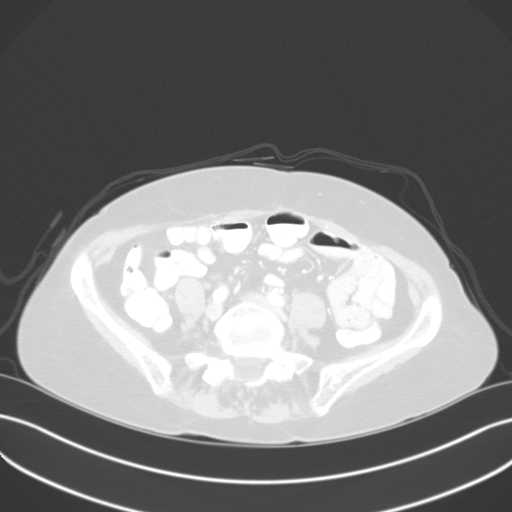
[im 49/122  lung]
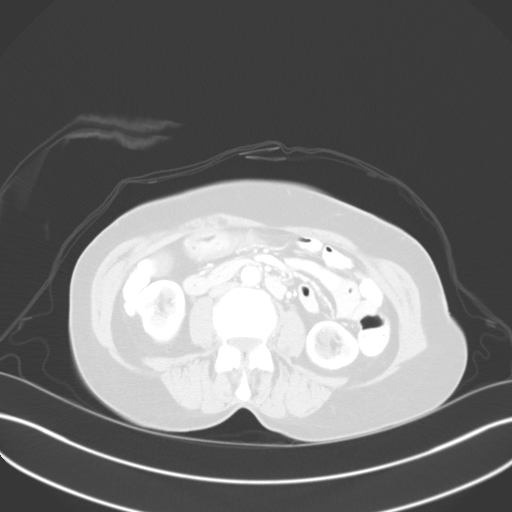
[im 61/122  mediastinal]
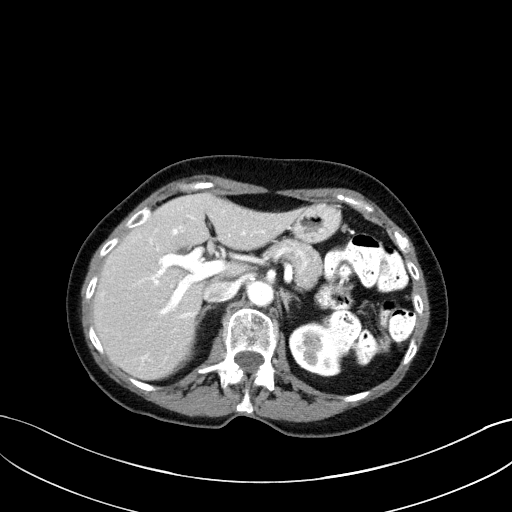
[im 61/122  lung]
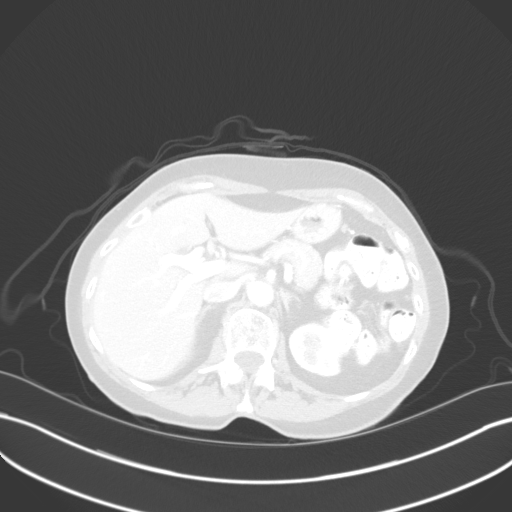
[im 73/122  lung]
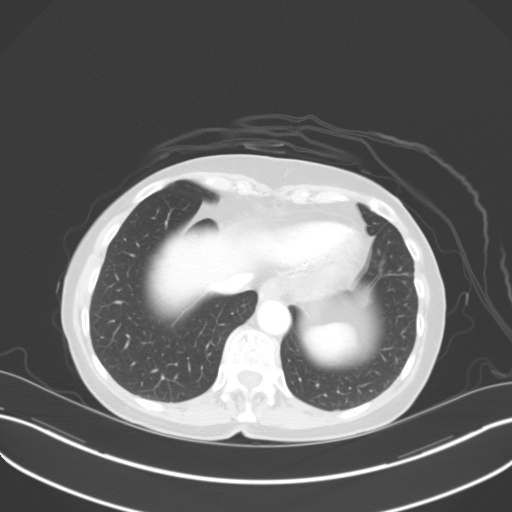
[im 85/122  lung]
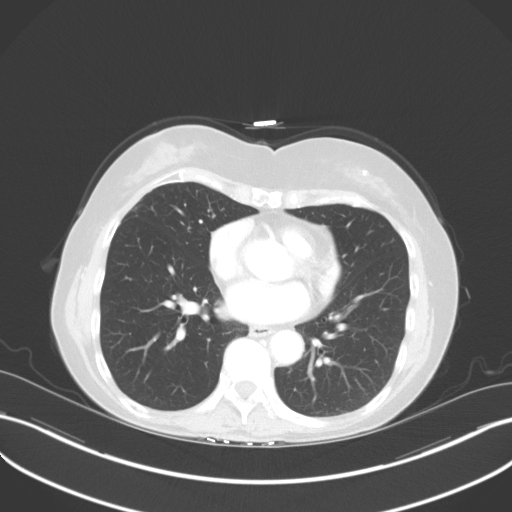
[im 97/122  lung]
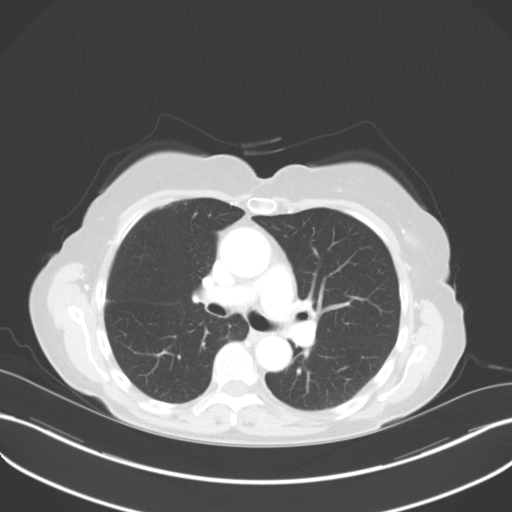
[im 109/122  mediastinal]
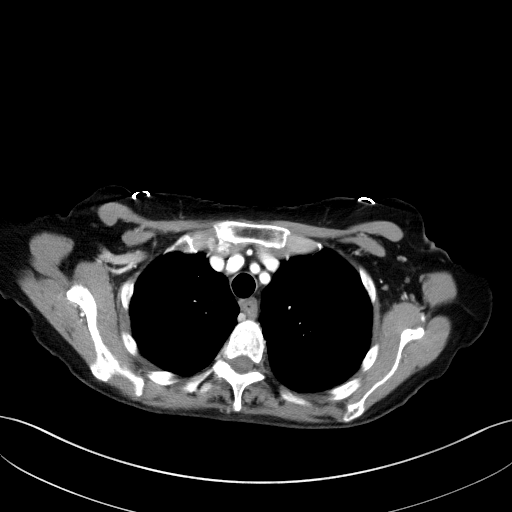
[im 109/122  lung]
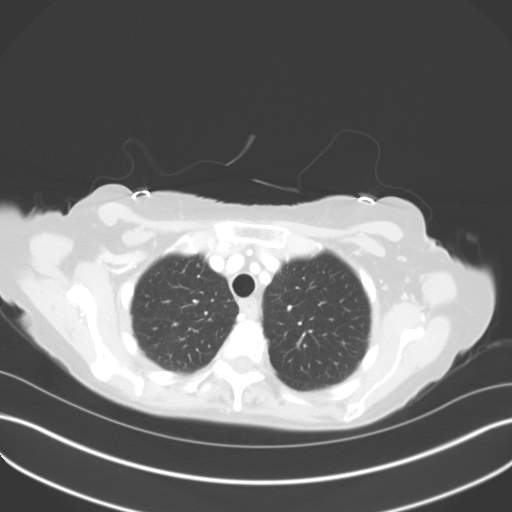

[Series 4: coronals · coronal · 0.69mm/px · 3 of 120 slices shown]
[im 24/120  lung]
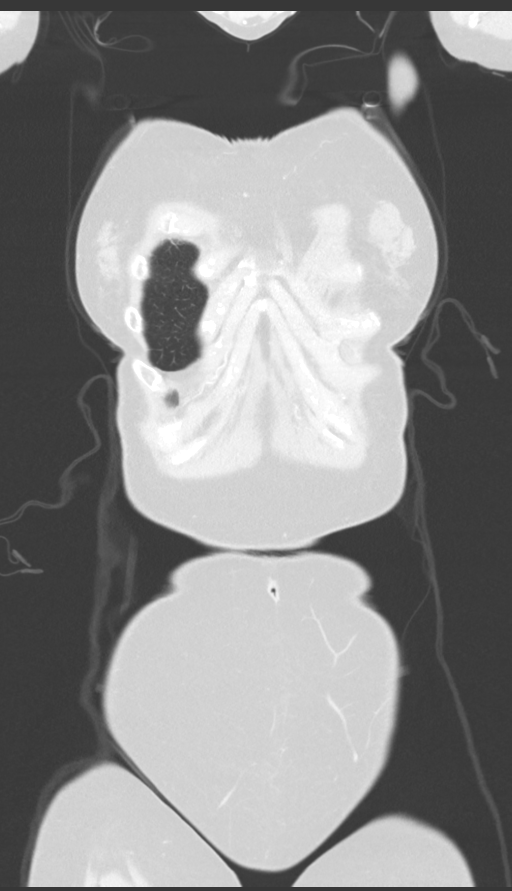
[im 48/120  lung]
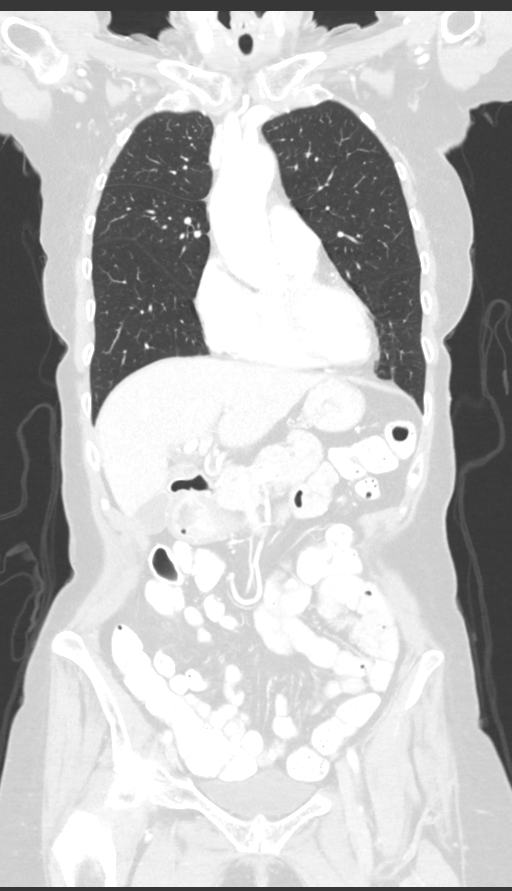
[im 72/120  lung]
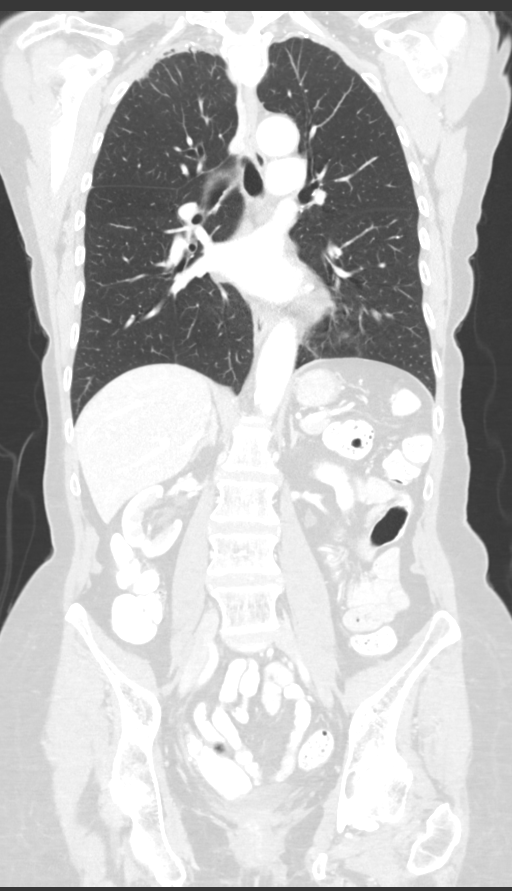

[12 of 36 positions shown; findings below may reference images not displayed]

FINDINGS: CT CHEST FINDINGS

Cardiovascular: The heart is normal in size. No pericardial
effusion. There is mild tortuosity and ectasia of the thoracic aorta
with a few scattered calcifications. Stable scattered coronary
artery calcifications.

Mediastinum/Nodes: No mediastinal or hilar mass or adenopathy. The
esophagus is grossly normal.

Lungs/Pleura: Biapical pleural and parenchymal scarring changes. No
acute pulmonary findings or worrisome pulmonary lesions. Mild
scarring type changes involving the anterior aspect of the right
lung likely due to radiation treatments of the right breast. No
pleural effusions or pleural nodules.

Musculoskeletal: No breast masses, supraclavicular or axillary
adenopathy. The thyroid gland appears normal.

The bony thorax is intact.  No worrisome bone lesions.

CT ABDOMEN PELVIS FINDINGS

Hepatobiliary: No focal hepatic lesions or evidence of peritoneal
surface disease. The gallbladder is normal. Mild stable common bile
duct dilatation.

Pancreas: No mass, inflammation or ductal dilatation.

Spleen: Normal size.  No focal lesions.

Adrenals/Urinary Tract: Adrenal the adrenal glands and kidneys are
unremarkable. No renal, ureteral or bladder calculi or mass.

Stomach/Bowel: The stomach, duodenum, small bowel and colon are
unremarkable. No acute inflammatory changes, mass lesions or
obstructive findings. Low lying cecum deep in the pelvis. The
terminal ileum is normal.

Vascular/Lymphatic: Scattered atherosclerotic calcifications
involving the aorta but no aneurysm or dissection. The branch
vessels are patent. The major venous structures are patent.

Reproductive: Surgically absent.

Other: No pelvic mass or adenopathy. No free pelvic fluid
collections. No findings for omental or peritoneal implants.

Musculoskeletal: No significant bony findings.
IMPRESSION: 1. Unremarkable and stable CT appearance of the chest, abdomen and
pelvis. No findings for recurrent peritoneal carcinomatosis.
2. No acute abdominal/pelvic findings.
3. Stable mild fusiform enlargement of the ascending aorta.

## 2020-02-17 DIAGNOSIS — K219 Gastro-esophageal reflux disease without esophagitis: Secondary | ICD-10-CM | POA: Diagnosis not present

## 2020-02-17 DIAGNOSIS — Z789 Other specified health status: Secondary | ICD-10-CM | POA: Diagnosis not present

## 2020-02-17 DIAGNOSIS — Z6821 Body mass index (BMI) 21.0-21.9, adult: Secondary | ICD-10-CM | POA: Diagnosis not present

## 2020-02-17 DIAGNOSIS — Z299 Encounter for prophylactic measures, unspecified: Secondary | ICD-10-CM | POA: Diagnosis not present

## 2020-02-17 DIAGNOSIS — R350 Frequency of micturition: Secondary | ICD-10-CM | POA: Diagnosis not present

## 2020-02-17 DIAGNOSIS — R1031 Right lower quadrant pain: Secondary | ICD-10-CM | POA: Diagnosis not present

## 2020-02-29 DIAGNOSIS — Z6821 Body mass index (BMI) 21.0-21.9, adult: Secondary | ICD-10-CM | POA: Diagnosis not present

## 2020-02-29 DIAGNOSIS — K589 Irritable bowel syndrome without diarrhea: Secondary | ICD-10-CM | POA: Diagnosis not present

## 2020-02-29 DIAGNOSIS — Z299 Encounter for prophylactic measures, unspecified: Secondary | ICD-10-CM | POA: Diagnosis not present

## 2020-02-29 DIAGNOSIS — Z713 Dietary counseling and surveillance: Secondary | ICD-10-CM | POA: Diagnosis not present

## 2020-02-29 DIAGNOSIS — K219 Gastro-esophageal reflux disease without esophagitis: Secondary | ICD-10-CM | POA: Diagnosis not present

## 2020-03-21 DIAGNOSIS — H26493 Other secondary cataract, bilateral: Secondary | ICD-10-CM | POA: Diagnosis not present

## 2020-03-21 DIAGNOSIS — D4981 Neoplasm of unspecified behavior of retina and choroid: Secondary | ICD-10-CM | POA: Diagnosis not present

## 2020-03-21 DIAGNOSIS — H5319 Other subjective visual disturbances: Secondary | ICD-10-CM | POA: Diagnosis not present

## 2020-03-21 DIAGNOSIS — Z961 Presence of intraocular lens: Secondary | ICD-10-CM | POA: Diagnosis not present

## 2020-05-09 DIAGNOSIS — D2239 Melanocytic nevi of other parts of face: Secondary | ICD-10-CM | POA: Diagnosis not present

## 2020-05-09 DIAGNOSIS — L821 Other seborrheic keratosis: Secondary | ICD-10-CM | POA: Diagnosis not present

## 2020-05-09 DIAGNOSIS — L814 Other melanin hyperpigmentation: Secondary | ICD-10-CM | POA: Diagnosis not present

## 2020-05-09 DIAGNOSIS — D2271 Melanocytic nevi of right lower limb, including hip: Secondary | ICD-10-CM | POA: Diagnosis not present

## 2020-05-09 DIAGNOSIS — Z85828 Personal history of other malignant neoplasm of skin: Secondary | ICD-10-CM | POA: Diagnosis not present

## 2020-05-09 DIAGNOSIS — D1801 Hemangioma of skin and subcutaneous tissue: Secondary | ICD-10-CM | POA: Diagnosis not present

## 2020-05-09 DIAGNOSIS — L570 Actinic keratosis: Secondary | ICD-10-CM | POA: Diagnosis not present

## 2020-05-09 DIAGNOSIS — D225 Melanocytic nevi of trunk: Secondary | ICD-10-CM | POA: Diagnosis not present

## 2020-05-09 DIAGNOSIS — D2272 Melanocytic nevi of left lower limb, including hip: Secondary | ICD-10-CM | POA: Diagnosis not present

## 2020-05-31 DIAGNOSIS — K589 Irritable bowel syndrome without diarrhea: Secondary | ICD-10-CM | POA: Diagnosis not present

## 2020-05-31 DIAGNOSIS — Z299 Encounter for prophylactic measures, unspecified: Secondary | ICD-10-CM | POA: Diagnosis not present

## 2020-05-31 DIAGNOSIS — C482 Malignant neoplasm of peritoneum, unspecified: Secondary | ICD-10-CM | POA: Diagnosis not present

## 2020-05-31 DIAGNOSIS — N183 Chronic kidney disease, stage 3 unspecified: Secondary | ICD-10-CM | POA: Diagnosis not present

## 2020-05-31 DIAGNOSIS — M1710 Unilateral primary osteoarthritis, unspecified knee: Secondary | ICD-10-CM | POA: Diagnosis not present

## 2020-06-22 DIAGNOSIS — Z1231 Encounter for screening mammogram for malignant neoplasm of breast: Secondary | ICD-10-CM | POA: Diagnosis not present

## 2020-06-25 DIAGNOSIS — Z23 Encounter for immunization: Secondary | ICD-10-CM | POA: Diagnosis not present

## 2020-07-11 DIAGNOSIS — K529 Noninfective gastroenteritis and colitis, unspecified: Secondary | ICD-10-CM | POA: Diagnosis not present

## 2020-08-07 DIAGNOSIS — M810 Age-related osteoporosis without current pathological fracture: Secondary | ICD-10-CM | POA: Diagnosis not present

## 2020-09-26 DIAGNOSIS — E7800 Pure hypercholesterolemia, unspecified: Secondary | ICD-10-CM | POA: Diagnosis not present

## 2020-09-26 DIAGNOSIS — Z681 Body mass index (BMI) 19 or less, adult: Secondary | ICD-10-CM | POA: Diagnosis not present

## 2020-09-26 DIAGNOSIS — Z1339 Encounter for screening examination for other mental health and behavioral disorders: Secondary | ICD-10-CM | POA: Diagnosis not present

## 2020-09-26 DIAGNOSIS — Z7189 Other specified counseling: Secondary | ICD-10-CM | POA: Diagnosis not present

## 2020-09-26 DIAGNOSIS — Z1331 Encounter for screening for depression: Secondary | ICD-10-CM | POA: Diagnosis not present

## 2020-09-26 DIAGNOSIS — Z0000 Encounter for general adult medical examination without abnormal findings: Secondary | ICD-10-CM | POA: Diagnosis not present

## 2020-09-26 DIAGNOSIS — R5383 Other fatigue: Secondary | ICD-10-CM | POA: Diagnosis not present

## 2020-10-02 DIAGNOSIS — E039 Hypothyroidism, unspecified: Secondary | ICD-10-CM | POA: Diagnosis not present

## 2020-10-02 DIAGNOSIS — N83209 Unspecified ovarian cyst, unspecified side: Secondary | ICD-10-CM | POA: Diagnosis not present

## 2020-10-02 DIAGNOSIS — E7800 Pure hypercholesterolemia, unspecified: Secondary | ICD-10-CM | POA: Diagnosis not present

## 2020-10-02 DIAGNOSIS — Z79899 Other long term (current) drug therapy: Secondary | ICD-10-CM | POA: Diagnosis not present

## 2020-10-02 DIAGNOSIS — E559 Vitamin D deficiency, unspecified: Secondary | ICD-10-CM | POA: Diagnosis not present

## 2020-10-02 DIAGNOSIS — R5383 Other fatigue: Secondary | ICD-10-CM | POA: Diagnosis not present

## 2020-10-29 NOTE — Progress Notes (Unsigned)
Date:  11/01/2020   ID:  Andrea Simpson, DOB [birth year], MRN [id number]  PCP:  Monico Blitz, MD  Cardiologist:   Johnsie Cancel Electrophysiologist:  None   Evaluation Performed:  Follow-Up Visit  Chief Complaint:  SVT  History of Present Illness:    75 y.o. first seen 03/22/19 for SVT. Referred by Dr Manuella Ghazi. History of breast cancer, primary peritoneal carcinomatosis, thyroid disease. Also takes statin for HLD  Echo done 02/14/19 normal EF 55-60% no valve disease ECG showed SR with isolated PVC and normal QT rate 93  TSH/T4 have been normal Monitor ordered by primary reviewed and showed 8% PaC;s with rare  SVT only 6-8 beats She was started on beta blockers.   She lives alone Has sister in [place] Has had multiple somatic complaints that did not correlate with arrhythmia on monitor and include tightness in chest, fatigue and dyspnea She subsequently had normal myovue done 03/28/19 with EF >65%   Still with episodes of throat tightness, fatigue and palpitations can last 20 minutes to 1.5 hours Suspect it may be anxiety related to the fact that she has had cancer twice  Not having to use PRN Inderal  Wants to have primary follow CT and CA125   Weight is down a bit Has not had CT in 3 years    Past Medical History:  Diagnosis Date  . Bowel obstruction ([place])    2015  . BRCA negative   . Breast cancer ([place])   . Cataract   . GERD (gastroesophageal reflux disease)   . Hypercholesterolemia   . Osteoporosis   . Peritoneal carcinoma ([place]) 2015  . Primary peritoneal carcinomatosis ([place])    presumed Stage 2C  . Thyroid disease   . Tubular adenoma of colon    Past Surgical History:  Procedure Laterality Date  . abdominal laparoscopic surgery  2015   bowel obstruction  . BREAST LUMPECTOMY  2003   bilateral  . COLONOSCOPY    . MASTECTOMY Bilateral    [id number] -Multiple biopsies  . portcath     . TOTAL ABDOMINAL HYSTERECTOMY W/ BILATERAL SALPINGOOPHORECTOMY  2015  . UPPER  GASTROINTESTINAL ENDOSCOPY    . wisdom teth extraction       No outpatient medications have been marked as taking for the 11/01/20 encounter (Office Visit) with Josue Hector, MD.     Allergies:   Penicillins, Tramadol, and Keflex [cephalexin]   Social History   Tobacco Use  . Smoking status: Former Smoker    Quit date: 05/04/1978    Years since quitting: 42.5  . Smokeless tobacco: Never Used  Vaping Use  . Vaping Use: Never used  Substance Use Topics  . Alcohol use: No    Alcohol/week: 0.0 standard drinks  . Drug use: No     Family Hx: The patient's family history includes Colon cancer in her father; Skin cancer in her father; Stomach cancer in her maternal aunt. There is no history of Esophageal cancer, Pancreatic cancer, Prostate cancer, or Rectal cancer.  ROS:   Please see the history of present illness.     All other systems reviewed and are negative.   Prior CV studies:   The following studies were reviewed today:  Myovue July 2020 Echo/Monitor Primary June 2020  Labs/Other Tests and Data Reviewed:    EKG:  February 14 2019 SR isolated PVC normal QT 11/01/2020 SR rate 68 normal   Recent Labs: No results found for requested labs within last 8760 hours.  Recent Lipid Panel No results found for: CHOL, TRIG, HDL, CHOLHDL, LDLCALC, LDLDIRECT  Wt Readings from Last 3 Encounters:  11/01/20 54.9 kg  11/07/19 57.6 kg  07/07/19 59.9 kg     Objective:    Vital Signs:  BP 124/88   Pulse 68   Ht _0  (1.676 m)   Wt 54.9 kg   SpO2 97%   BMI 19.53 kg/m    Affect appropriate Healthy:  appears stated age 75: normal Neck supple with no adenopathy JVP normal no bruits no thyromegaly Lungs clear with no wheezing and good diaphragmatic motion Heart:  S1/S2 no murmur, no rub, gallop or click PMI normal Abdomen: benighn, BS positve, no tenderness, no AAA no bruit.  No HSM or HJR Distal pulses intact with no bruits No edema Neuro non-focal Skin warm and  dry No muscular weakness   ASSESSMENT & PLAN:    1. Chest pain:  Atypical normal myovue 03/28/19 observe 2. Dyspnea:  Non cardiac echo 02/14/19 normal  3. Atrial Arrhythmias:  PaC;s and short bursts SVT continue beta blocker PRN Inderal  4. Thyroid:  Continue current synthroid dose labs with primary  5. HLD:  On statin labs with primary  6. Primary Peritoneal Carcinomatosis:  Post chemo/XRT CT 07/07/19 stable f/u oncology encouraged Her to have CT ordered through primary if not going to f/u with oncology   COVID-19 Education: The signs and symptoms of COVID-19 were discussed with the patient and how to seek care for testing (follow up with PCP or arrange E-visit).  The importance of social distancing was discussed today.     Medication Adjustments/Labs and Tests Ordered: Current medicines are reviewed at length with the patient today.  Concerns regarding medicines are outlined above.   Tests Ordered:  None   Medication Changes:  None   Disposition:  Follow up in a year  Signed, Jenkins Rouge, MD  11/01/2020 10:46 AM    [place]

## 2020-11-01 ENCOUNTER — Encounter: Payer: Self-pay | Admitting: Cardiovascular Disease

## 2020-11-01 ENCOUNTER — Ambulatory Visit: Payer: Medicare PPO | Admitting: Cardiovascular Disease

## 2020-11-01 ENCOUNTER — Other Ambulatory Visit: Payer: Self-pay

## 2020-11-01 VITALS — BP 124/88 | HR 68 | Ht 66.0 in | Wt 121.0 lb

## 2020-11-01 DIAGNOSIS — R0600 Dyspnea, unspecified: Secondary | ICD-10-CM | POA: Diagnosis not present

## 2020-11-01 DIAGNOSIS — R002 Palpitations: Secondary | ICD-10-CM

## 2020-11-01 DIAGNOSIS — R0609 Other forms of dyspnea: Secondary | ICD-10-CM

## 2020-11-01 NOTE — Patient Instructions (Signed)

## 2021-01-14 DIAGNOSIS — Z713 Dietary counseling and surveillance: Secondary | ICD-10-CM | POA: Diagnosis not present

## 2021-01-14 DIAGNOSIS — M549 Dorsalgia, unspecified: Secondary | ICD-10-CM | POA: Diagnosis not present

## 2021-01-14 DIAGNOSIS — Z681 Body mass index (BMI) 19 or less, adult: Secondary | ICD-10-CM | POA: Diagnosis not present

## 2021-01-14 DIAGNOSIS — Z299 Encounter for prophylactic measures, unspecified: Secondary | ICD-10-CM | POA: Diagnosis not present

## 2021-01-14 DIAGNOSIS — K219 Gastro-esophageal reflux disease without esophagitis: Secondary | ICD-10-CM | POA: Diagnosis not present

## 2021-02-05 DIAGNOSIS — M810 Age-related osteoporosis without current pathological fracture: Secondary | ICD-10-CM | POA: Diagnosis not present

## 2021-03-25 DIAGNOSIS — H26493 Other secondary cataract, bilateral: Secondary | ICD-10-CM | POA: Diagnosis not present

## 2021-03-25 DIAGNOSIS — H5319 Other subjective visual disturbances: Secondary | ICD-10-CM | POA: Diagnosis not present

## 2021-03-25 DIAGNOSIS — D4981 Neoplasm of unspecified behavior of retina and choroid: Secondary | ICD-10-CM | POA: Diagnosis not present

## 2021-03-25 DIAGNOSIS — Z961 Presence of intraocular lens: Secondary | ICD-10-CM | POA: Diagnosis not present

## 2021-04-17 ENCOUNTER — Ambulatory Visit: Payer: Self-pay

## 2021-04-17 ENCOUNTER — Ambulatory Visit (INDEPENDENT_AMBULATORY_CARE_PROVIDER_SITE_OTHER): Payer: Medicare PPO

## 2021-04-17 ENCOUNTER — Encounter: Payer: Self-pay | Admitting: Orthopaedic Surgery

## 2021-04-17 ENCOUNTER — Other Ambulatory Visit: Payer: Self-pay

## 2021-04-17 ENCOUNTER — Ambulatory Visit (INDEPENDENT_AMBULATORY_CARE_PROVIDER_SITE_OTHER): Payer: Medicare PPO | Admitting: Orthopaedic Surgery

## 2021-04-17 VITALS — Ht 66.0 in | Wt 121.0 lb

## 2021-04-17 DIAGNOSIS — G8929 Other chronic pain: Secondary | ICD-10-CM

## 2021-04-17 DIAGNOSIS — M170 Bilateral primary osteoarthritis of knee: Secondary | ICD-10-CM

## 2021-04-17 DIAGNOSIS — M25561 Pain in right knee: Secondary | ICD-10-CM

## 2021-04-17 DIAGNOSIS — M25562 Pain in left knee: Secondary | ICD-10-CM

## 2021-04-17 NOTE — Progress Notes (Signed)
Office Visit Note   Patient: Andrea Simpson           Date of Birth: 06-03-1946           MRN: [id number] Visit Date: 04/17/2021              Requested by: Monico Blitz, MD [address] [place],  [place] [address] PCP: Monico Blitz, MD   Assessment & Plan: Visit Diagnoses:  1. Chronic pain of both knees   2. Bilateral primary osteoarthritis of knee     Plan: Andrea Simpson has bilateral knee osteoarthritis that by x-ray appears to be moderate to advanced.  She also has evidence of CPPD.  Long discussion regarding different treatment options.  Presently she is taking Advil it seems to make a big difference.  Have discussed exercises i.e. nonweightbearing and even trying Voltaren gel.  Some point in the future she may want to consider cortisone injection or possibly viscosupplementation.  She did not think she was ready for that yet.  Presently not having any significant compromise of her activities  Follow-Up Instructions: Return if symptoms worsen or fail to improve.   Orders:  Orders Placed This Encounter  Procedures   XR KNEE 3 VIEW LEFT   XR KNEE 3 VIEW RIGHT   No orders of the defined types were placed in this encounter.     Procedures: No procedures performed   Clinical Data: No additional findings.   Subjective: Chief Complaint  Patient presents with   Right Knee - Pain   Left Knee - Pain  Patient presents today for bilateral knee pain. She said that they have been bothering her on and off for around 6 months. They have been worsening with time. Her left is worse than the right. Her pain is located medially with each knee. No grinding or giving way. She said that it is painful to stand after sitting with her legs crossed. She takes Advil if needed.  May take as many as 5 over the course of the day but not every day they make a big difference in terms of her pain.  Not having any issues with instability.  Notes that she does have some issues with balance  HPI  Review of  Systems   Objective: Vital Signs: Ht '5\' 6"'  (1.676 m)   Wt 121 lb (54.9 kg)   BMI 19.53 kg/m   Physical Exam Constitutional:      Appearance: She is well-developed.  Pulmonary:     Effort: Pulmonary effort is normal.  Skin:    General: Skin is warm and dry.  Neurological:     Mental Status: She is alert and oriented to person, place, and time.  Psychiatric:        Behavior: Behavior normal.    Ortho Exam awake alert and oriented x3.  Comfortable sitting.  No effusion of either knee.  The knees were not hot red warm or swollen.  Full quick extension of flexed over 105 degrees.  No opening with varus valgus stress.  Mostly medial joint pain diffusely.  No pain laterally.  Some patella crepitation but no pain with patella compression.  No popliteal pain or fullness.  No calf pain.  Neurologically intact.  Straight leg raise.  No pain with range of motion of either hip  Specialty Comments:  No specialty comments available.  Imaging: XR KNEE 3 VIEW LEFT  Result Date: 04/17/2021 Films of the left knee were obtained in 3 projections standing.  There  does appear to be some decrease in bone mineralization.  There is diffuse calcification of both menisci consistent with CPPD.  Alignment of the knee is neutral.  Some narrowing of the medial lateral joint space with small peripheral osteophytes.  Patellofemoral arthritis also identified more along the lateral patellofemoral facet with osteophyte formation.  Patella otherwise tracks in the midline.  No acute changes.  Films consistent with advanced osteoarthritis with CPPD  XR KNEE 3 VIEW RIGHT  Result Date: 04/17/2021 Films of the right knee were obtained in 3 projections standing and compared to the left knee.  Findings are somewhat similar to that there is decreased bone mineralization.  Calcification of the menisci consistent with CPPD and peripheral small osteophytes in the medial lateral compartment.  There is some mild narrowing of the  joint spaces but no acute changes.  Also patellofemoral arthritis similar to the left.  Films are consistent with moderate to advanced osteoarthritis with CPPD    PMFS History: Patient Active Problem List   Diagnosis Date Noted   Bilateral primary osteoarthritis of knee 04/17/2021   Dyslipidemia 04/30/2016   HX: breast cancer 04/30/2016   Osteoporosis 04/30/2016   BRCA negative 04/24/2015   Chemotherapy induced neutropenia ([place]) 05/30/2014   Primary peritoneal carcinomatosis ([place]) 02/08/2014   Past Medical History:  Diagnosis Date   Bowel obstruction ([place])    2015   BRCA negative    Breast cancer ([place])    Cataract    GERD (gastroesophageal reflux disease)    Hypercholesterolemia    Osteoporosis    Peritoneal carcinoma ([place]) 2015   Primary peritoneal carcinomatosis ([place])    presumed Stage 2C   Thyroid disease    Tubular adenoma of colon     Family History  Problem Relation Age of Onset   Colon cancer Father        ? late 21's early 77's.. Died at age 2.   Skin cancer Father    Stomach cancer Maternal Aunt    Esophageal cancer Neg Hx    Pancreatic cancer Neg Hx    Prostate cancer Neg Hx    Rectal cancer Neg Hx     Past Surgical History:  Procedure Laterality Date   abdominal laparoscopic surgery  2015   bowel obstruction   BREAST LUMPECTOMY  2003   bilateral   COLONOSCOPY     MASTECTOMY Bilateral    1967-2003 -Multiple biopsies   portcath      TOTAL ABDOMINAL HYSTERECTOMY W/ BILATERAL SALPINGOOPHORECTOMY  2015   UPPER GASTROINTESTINAL ENDOSCOPY     wisdom teth extraction     Social History   Occupational History   Not on file  Tobacco Use   Smoking status: Former    Types: Cigarettes    Quit date: 05/04/1978    Years since quitting: 42.9   Smokeless tobacco: Never  Vaping Use   Vaping Use: Never used  Substance and Sexual Activity   Alcohol use: No    Alcohol/week: 0.0 standard drinks   Drug use: No   Sexual activity: Not on file

## 2021-05-02 DIAGNOSIS — E7800 Pure hypercholesterolemia, unspecified: Secondary | ICD-10-CM | POA: Diagnosis not present

## 2021-05-02 DIAGNOSIS — N183 Chronic kidney disease, stage 3 unspecified: Secondary | ICD-10-CM | POA: Diagnosis not present

## 2021-05-02 DIAGNOSIS — Z299 Encounter for prophylactic measures, unspecified: Secondary | ICD-10-CM | POA: Diagnosis not present

## 2021-05-02 DIAGNOSIS — M1710 Unilateral primary osteoarthritis, unspecified knee: Secondary | ICD-10-CM | POA: Diagnosis not present

## 2021-05-02 DIAGNOSIS — Z681 Body mass index (BMI) 19 or less, adult: Secondary | ICD-10-CM | POA: Diagnosis not present

## 2021-05-10 DIAGNOSIS — E2839 Other primary ovarian failure: Secondary | ICD-10-CM | POA: Diagnosis not present

## 2021-05-17 DIAGNOSIS — M170 Bilateral primary osteoarthritis of knee: Secondary | ICD-10-CM | POA: Diagnosis not present

## 2021-06-05 DIAGNOSIS — Z23 Encounter for immunization: Secondary | ICD-10-CM | POA: Diagnosis not present

## 2021-06-27 DIAGNOSIS — Z23 Encounter for immunization: Secondary | ICD-10-CM | POA: Diagnosis not present

## 2021-06-28 DIAGNOSIS — Z1231 Encounter for screening mammogram for malignant neoplasm of breast: Secondary | ICD-10-CM | POA: Diagnosis not present

## 2021-07-10 DIAGNOSIS — L738 Other specified follicular disorders: Secondary | ICD-10-CM | POA: Diagnosis not present

## 2021-07-10 DIAGNOSIS — D2239 Melanocytic nevi of other parts of face: Secondary | ICD-10-CM | POA: Diagnosis not present

## 2021-07-10 DIAGNOSIS — D2271 Melanocytic nevi of right lower limb, including hip: Secondary | ICD-10-CM | POA: Diagnosis not present

## 2021-07-10 DIAGNOSIS — Z85828 Personal history of other malignant neoplasm of skin: Secondary | ICD-10-CM | POA: Diagnosis not present

## 2021-07-10 DIAGNOSIS — L821 Other seborrheic keratosis: Secondary | ICD-10-CM | POA: Diagnosis not present

## 2021-07-10 DIAGNOSIS — L570 Actinic keratosis: Secondary | ICD-10-CM | POA: Diagnosis not present

## 2021-07-10 DIAGNOSIS — D1801 Hemangioma of skin and subcutaneous tissue: Secondary | ICD-10-CM | POA: Diagnosis not present

## 2021-07-11 DIAGNOSIS — M25562 Pain in left knee: Secondary | ICD-10-CM | POA: Diagnosis not present

## 2021-08-05 DIAGNOSIS — M810 Age-related osteoporosis without current pathological fracture: Secondary | ICD-10-CM | POA: Diagnosis not present

## 2021-09-27 DIAGNOSIS — C482 Malignant neoplasm of peritoneum, unspecified: Secondary | ICD-10-CM | POA: Diagnosis not present

## 2021-09-27 DIAGNOSIS — E559 Vitamin D deficiency, unspecified: Secondary | ICD-10-CM | POA: Diagnosis not present

## 2021-09-27 DIAGNOSIS — Z7189 Other specified counseling: Secondary | ICD-10-CM | POA: Diagnosis not present

## 2021-09-27 DIAGNOSIS — E7800 Pure hypercholesterolemia, unspecified: Secondary | ICD-10-CM | POA: Diagnosis not present

## 2021-09-27 DIAGNOSIS — Z79899 Other long term (current) drug therapy: Secondary | ICD-10-CM | POA: Diagnosis not present

## 2021-09-27 DIAGNOSIS — R5383 Other fatigue: Secondary | ICD-10-CM | POA: Diagnosis not present

## 2021-09-27 DIAGNOSIS — Z299 Encounter for prophylactic measures, unspecified: Secondary | ICD-10-CM | POA: Diagnosis not present

## 2021-09-27 DIAGNOSIS — Z1331 Encounter for screening for depression: Secondary | ICD-10-CM | POA: Diagnosis not present

## 2021-09-27 DIAGNOSIS — Z0000 Encounter for general adult medical examination without abnormal findings: Secondary | ICD-10-CM | POA: Diagnosis not present

## 2021-09-27 DIAGNOSIS — Z1339 Encounter for screening examination for other mental health and behavioral disorders: Secondary | ICD-10-CM | POA: Diagnosis not present

## 2021-09-27 DIAGNOSIS — E038 Other specified hypothyroidism: Secondary | ICD-10-CM | POA: Diagnosis not present

## 2021-09-27 DIAGNOSIS — Z6820 Body mass index (BMI) 20.0-20.9, adult: Secondary | ICD-10-CM | POA: Diagnosis not present

## 2021-09-27 DIAGNOSIS — M11269 Other chondrocalcinosis, unspecified knee: Secondary | ICD-10-CM | POA: Diagnosis not present

## 2021-10-30 NOTE — Progress Notes (Signed)
Date:  11/04/2021   ID:  Andrea Simpson, DOB Nov 26, 1945, MRN [id number]  PCP:  Monico Blitz, MD  Cardiologist:   Johnsie Cancel Electrophysiologist:  None   Evaluation Performed:  Follow-Up Visit  Chief Complaint:  SVT  History of Present Illness:    76 y.o. first seen 03/22/19 for SVT. Referred by Dr Manuella Ghazi. History of breast cancer, primary peritoneal carcinomatosis, thyroid disease. Also takes statin for HLD  Echo done 02/14/19 normal EF 55-60% no valve disease ECG showed SR with isolated PVC and normal QT rate 93  TSH/T4 have been normal Monitor ordered by primary reviewed and showed 8% PaC;s with rare  SVT only 6-8 beats She was started on beta blockers.   She lives alone Has sister in [place] Has had multiple somatic complaints that did not correlate with arrhythmia on monitor and include tightness in chest, fatigue and dyspnea She subsequently had normal myovue done 03/28/19 with EF >65%   Still with episodes of throat tightness, fatigue and palpitations can last 20 minutes to 1.5 hours Suspect it may be anxiety related to the fact that she has had cancer twice  Not having to use PRN Inderal  Wants to have primary follow CT and CA125   Weight is down a bit Has not had CT since 07/07/19 At that time CA 125 was normal  She had her shingles shot last week and left arm sore    Past Medical History:  Diagnosis Date   Bowel obstruction ([place])    2015   BRCA negative    Breast cancer ([place])    Cataract    GERD (gastroesophageal reflux disease)    Hypercholesterolemia    Osteoporosis    Peritoneal carcinoma ([place]) 2015   Primary peritoneal carcinomatosis ([place])    presumed Stage 2C   Thyroid disease    Tubular adenoma of colon    Past Surgical History:  Procedure Laterality Date   abdominal laparoscopic surgery  2015   bowel obstruction   BREAST LUMPECTOMY  2003   bilateral   COLONOSCOPY     MASTECTOMY Bilateral    1967-2003 -Multiple biopsies   portcath      TOTAL  ABDOMINAL HYSTERECTOMY W/ BILATERAL SALPINGOOPHORECTOMY  2015   UPPER GASTROINTESTINAL ENDOSCOPY     wisdom teth extraction       Current Meds  Medication Sig   azelastine (ASTELIN) 0.1 % nasal spray Place into the nose 2 (two) times daily as needed.    denosumab (PROLIA) 60 MG/ML SOLN injection Inject 60 mg into the skin every 6 (six) months. Administer in upper arm, thigh, or abdomen   levothyroxine (SYNTHROID, LEVOTHROID) 25 MCG tablet Take 25 mcg by mouth daily.    metoprolol succinate (TOPROL-XL) 25 MG 24 hr tablet Take 25 mg by mouth daily.   Multiple Vitamin (MULTIVITAMIN) tablet Take 1 tablet by mouth daily.   omeprazole (PRILOSEC) 20 MG capsule Take 20 mg by mouth daily.   polyethylene glycol (MIRALAX / GLYCOLAX) packet Take by mouth 2 (two) times daily.    simvastatin (ZOCOR) 20 MG tablet Take 20 mg by mouth at bedtime.     Allergies:   Penicillins, Tramadol, and Keflex [cephalexin]   Social History   Tobacco Use   Smoking status: Former    Types: Cigarettes    Quit date: 05/04/1978    Years since quitting: 43.5   Smokeless tobacco: Never  Vaping Use   Vaping Use: Never used  Substance Use Topics   Alcohol  use: No    Alcohol/week: 0.0 standard drinks   Drug use: No     Family Hx: The patient's family history includes Colon cancer in her father; Skin cancer in her father; Stomach cancer in her maternal aunt. There is no history of Esophageal cancer, Pancreatic cancer, Prostate cancer, or Rectal cancer.  ROS:   Please see the history of present illness.     All other systems reviewed and are negative.   Prior CV studies:   The following studies were reviewed today:  Myovue July 2020 Echo/Monitor Primary June 2020  Labs/Other Tests and Data Reviewed:    EKG:   11/04/2021 NSR rate 66 normal   Recent Labs: No results found for requested labs within last 8760 hours.   Recent Lipid Panel No results found for: CHOL, TRIG, HDL, CHOLHDL, LDLCALC,  LDLDIRECT  Wt Readings from Last 3 Encounters:  11/04/21 122 lb (55.3 kg)  04/17/21 121 lb (54.9 kg)  11/01/20 121 lb (54.9 kg)     Objective:    Vital Signs:  BP 136/70    Pulse 66    Ht 5' 6.5" (1.689 m)    Wt 122 lb (55.3 kg)    SpO2 99%    BMI 19.40 kg/m    Affect appropriate Healthy:  appears stated age HEENT: normal Neck supple with no adenopathy JVP normal no bruits no thyromegaly Lungs clear with no wheezing and good diaphragmatic motion Heart:  S1/S2 no murmur, no rub, gallop or click PMI normal Abdomen: benighn, BS positve, no tenderness, no AAA no bruit.  No HSM or HJR Distal pulses intact with no bruits No edema Neuro non-focal Skin warm and dry No muscular weakness   ASSESSMENT & PLAN:    1. Chest pain:  Atypical normal myovue 03/28/19 observe 2. Dyspnea:  Non cardiac echo 02/14/19 normal  3. Atrial Arrhythmias:  PaC;s and short bursts SVT continue beta blocker PRN Inderal  4. Thyroid:  Continue current synthroid dose labs with primary  5. HLD:  On statin labs with primary  6. Primary Peritoneal Carcinomatosis:  Post chemo/XRT CT 07/07/19 stable f/u oncology encouraged Her to have CT ordered through primary if not going to f/u with oncology   COVID-19 Education: The signs and symptoms of COVID-19 were discussed with the patient and how to seek care for testing (follow up with PCP or arrange E-visit).  The importance of social distancing was discussed today.     Medication Adjustments/Labs and Tests Ordered: Current medicines are reviewed at length with the patient today.  Concerns regarding medicines are outlined above.   Tests Ordered:  None   Medication Changes:  None   Disposition:  Follow up  in a year  Signed, Jenkins Rouge, MD  11/04/2021 9:33 AM    [place]

## 2021-11-04 ENCOUNTER — Other Ambulatory Visit: Payer: Self-pay

## 2021-11-04 ENCOUNTER — Encounter: Payer: Self-pay | Admitting: Cardiovascular Disease

## 2021-11-04 ENCOUNTER — Ambulatory Visit: Payer: Medicare PPO | Admitting: Cardiovascular Disease

## 2021-11-04 VITALS — BP 136/70 | HR 66 | Ht 66.5 in | Wt 122.0 lb

## 2021-11-04 DIAGNOSIS — R079 Chest pain, unspecified: Secondary | ICD-10-CM | POA: Diagnosis not present

## 2021-11-04 DIAGNOSIS — R0609 Other forms of dyspnea: Secondary | ICD-10-CM | POA: Diagnosis not present

## 2021-11-04 DIAGNOSIS — I491 Atrial premature depolarization: Secondary | ICD-10-CM

## 2021-11-04 DIAGNOSIS — R002 Palpitations: Secondary | ICD-10-CM | POA: Diagnosis not present

## 2021-11-04 NOTE — Patient Instructions (Signed)
Medication Instructions:  Your physician recommends that you continue on your current medications as directed. Please refer to the Current Medication list given to you today.  *If you need a refill on your cardiac medications before your next appointment, please call your pharmacy*  Lab Work: If you have labs (blood work) drawn today and your tests are completely normal, you will receive your results only by: [place] (if you have MyChart) OR A paper copy in the mail If you have any lab test that is abnormal or we need to change your treatment, we will call you to review the results.  Testing/Procedures: None ordered today  Follow-Up: At [facility], you and your health needs are our priority.  As part of our continuing mission to provide you with exceptional heart care, we have created designated Provider Care Teams.  These Care Teams include your primary Cardiologist (physician) and Advanced Practice Providers (APPs -  Physician Assistants and Nurse Practitioners) who all work together to provide you with the care you need, when you need it.  We recommend signing up for the patient portal called "MyChart".  Sign up information is provided on this After Visit Summary.  MyChart is used to connect with patients for Virtual Visits (Telemedicine).  Patients are able to view lab/test results, encounter notes, upcoming appointments, etc.  Non-urgent messages can be sent to your provider as well.   To learn more about what you can do with MyChart, go to [email or web address].    Your next appointment:   1 year(s)  The format for your next appointment:   In Person  Provider:   Jenkins Rouge, MD {

## 2021-12-03 DIAGNOSIS — J069 Acute upper respiratory infection, unspecified: Secondary | ICD-10-CM | POA: Diagnosis not present

## 2021-12-03 DIAGNOSIS — Z299 Encounter for prophylactic measures, unspecified: Secondary | ICD-10-CM | POA: Diagnosis not present

## 2021-12-03 DIAGNOSIS — Z87891 Personal history of nicotine dependence: Secondary | ICD-10-CM | POA: Diagnosis not present

## 2022-01-02 ENCOUNTER — Telehealth: Payer: Self-pay | Admitting: Cardiovascular Disease

## 2022-01-02 DIAGNOSIS — I471 Supraventricular tachycardia: Secondary | ICD-10-CM

## 2022-01-02 DIAGNOSIS — R002 Palpitations: Secondary | ICD-10-CM

## 2022-01-02 NOTE — Telephone Encounter (Signed)
Follow Up: ? ? ? ? ? ?Patient was calling back to see what was decided after her episode last night. She said that she would like to find out something today. ?

## 2022-01-02 NOTE — Telephone Encounter (Addendum)
Patient called stating last night she needed to call the paramedics because she experienced rapid heart beat, they came out and did a work up on her.   When they did the EKG they were able to see capture her episode of rapid heart beat.  She said it stop and then started again while they were there.  She has the strip to it.  They gave her the option to go with them to the ER or call the on call provider she decided to call the on call provider.  She said she spoke to some from [facility]. He told her he would send [place] a message and some would call her.  She called [place] to follow up on that.  Please advise.  ?

## 2022-01-02 NOTE — Telephone Encounter (Signed)
Followed up with pt. ?Pt reports she experienced an episode of something, but not sure what.  It last over 20 min, scared her and she called 911.  [facility] evaluated her, she isn't sure rhythm, but has the strips from EMS.   ?States event started, stopped, started again. ?She was sitting at the time of occurrence.  ?She had been moving some things around that day, but not extremely heavy.   ?Aware she may be asked to drop of strips to office for MD review. ?Aware we may just watchful wait with infrequent episodes. ?Informed that I would send to MD & RN for review/advisement. ?Aware Dr. Kyla Balzarine nurse will follow up. ?Patient verbalized understanding and agreeable to plan.  ? ?

## 2022-01-02 NOTE — Telephone Encounter (Signed)
Pt informed that nurse would follow up with her on Dr. Kyla Balzarine recommendation tomorrow. ?Asked if she has another episode/issue -- pt reports no further issue/episode. ?Aware his nurse will follow up on monitor and referral. ?Pt did not seem happy with my response. ?

## 2022-01-03 ENCOUNTER — Encounter: Payer: Self-pay | Admitting: Radiology

## 2022-01-03 NOTE — Addendum Note (Signed)
Addended by: Aris Georgia, Tayloranne Lekas L on: 01/03/2022 08:56 AM ? ? Modules accepted: Orders ? ?

## 2022-01-03 NOTE — Telephone Encounter (Signed)
Called patient. See previous note. ?

## 2022-01-03 NOTE — Telephone Encounter (Signed)
Patient came by [facility] office and dropped off EKG's from EMS  ? ? ? ? ? ? ? ? ? ? ?

## 2022-01-03 NOTE — Telephone Encounter (Signed)
Per Dr. Johnsie Cancel, She has a history of SVT baseline ECG ok with no pre excitation can offer her 30 day monitor and have her f/u with EP. ? ?Called patient back about Dr. Kyla Balzarine recommendations. Patient stated she was going to go to the [place] [facility] office to have them fax our office EKG strip from EMS. Informed patient that she will receive monitor in the mail. Encouraged patient to call with any questions. Placed referral for Dr. Lovena Le in [place]. Patient verbalized understanding. ?

## 2022-01-03 NOTE — Progress Notes (Signed)
Enrolled patient for a 30 day [employer] event monitor to be mailed to patients home  

## 2022-01-13 ENCOUNTER — Telehealth: Payer: Self-pay | Admitting: Cardiovascular Disease

## 2022-01-13 NOTE — Telephone Encounter (Signed)
Patient states she has not received her heart monitor and she would like to know if we are able to track it. Please advise. ?

## 2022-01-13 NOTE — Telephone Encounter (Signed)
Patient calling back.   °

## 2022-01-14 NOTE — Telephone Encounter (Signed)
According to [place] website and [employer] tracking, your monitor should be delivered Wednesday, May 06/2022, by 10:30AM. ?

## 2022-01-15 ENCOUNTER — Ambulatory Visit (INDEPENDENT_AMBULATORY_CARE_PROVIDER_SITE_OTHER): Payer: Medicare PPO

## 2022-01-15 DIAGNOSIS — R002 Palpitations: Secondary | ICD-10-CM | POA: Diagnosis not present

## 2022-01-15 DIAGNOSIS — I471 Supraventricular tachycardia: Secondary | ICD-10-CM | POA: Diagnosis not present

## 2022-02-04 DIAGNOSIS — M810 Age-related osteoporosis without current pathological fracture: Secondary | ICD-10-CM | POA: Diagnosis not present

## 2022-02-24 ENCOUNTER — Telehealth: Payer: Self-pay | Admitting: Cardiovascular Disease

## 2022-02-24 NOTE — Telephone Encounter (Signed)
Pt returning nurses call regarding results. Please advise 

## 2022-02-24 NOTE — Telephone Encounter (Signed)
Josue Hector, MD  02/18/2022  8:47 AM EDT     No significant arrhythmia   The patient has been notified of the result and verbalized understanding.  All questions (if any) were answered. Nuala Alpha, LPN 8/44/1712 7:87 AM

## 2022-03-03 ENCOUNTER — Ambulatory Visit: Payer: Medicare PPO | Admitting: Internal Medicine

## 2022-03-03 ENCOUNTER — Encounter: Payer: Self-pay | Admitting: Internal Medicine

## 2022-03-03 DIAGNOSIS — R002 Palpitations: Secondary | ICD-10-CM | POA: Diagnosis not present

## 2022-03-03 DIAGNOSIS — I491 Atrial premature depolarization: Secondary | ICD-10-CM

## 2022-03-07 DIAGNOSIS — Z6820 Body mass index (BMI) 20.0-20.9, adult: Secondary | ICD-10-CM | POA: Diagnosis not present

## 2022-03-07 DIAGNOSIS — K219 Gastro-esophageal reflux disease without esophagitis: Secondary | ICD-10-CM | POA: Diagnosis not present

## 2022-03-07 DIAGNOSIS — I471 Supraventricular tachycardia: Secondary | ICD-10-CM | POA: Diagnosis not present

## 2022-03-07 DIAGNOSIS — Z299 Encounter for prophylactic measures, unspecified: Secondary | ICD-10-CM | POA: Diagnosis not present

## 2022-03-07 DIAGNOSIS — R42 Dizziness and giddiness: Secondary | ICD-10-CM | POA: Diagnosis not present

## 2022-03-17 DIAGNOSIS — Z299 Encounter for prophylactic measures, unspecified: Secondary | ICD-10-CM | POA: Diagnosis not present

## 2022-03-17 DIAGNOSIS — I471 Supraventricular tachycardia: Secondary | ICD-10-CM | POA: Diagnosis not present

## 2022-03-17 DIAGNOSIS — R42 Dizziness and giddiness: Secondary | ICD-10-CM | POA: Diagnosis not present

## 2022-03-17 DIAGNOSIS — Z681 Body mass index (BMI) 19 or less, adult: Secondary | ICD-10-CM | POA: Diagnosis not present

## 2022-03-17 DIAGNOSIS — C482 Malignant neoplasm of peritoneum, unspecified: Secondary | ICD-10-CM | POA: Diagnosis not present

## 2022-03-20 ENCOUNTER — Other Ambulatory Visit: Payer: Self-pay | Admitting: Internal Medicine

## 2022-03-20 ENCOUNTER — Other Ambulatory Visit (HOSPITAL_COMMUNITY): Payer: Self-pay | Admitting: Internal Medicine

## 2022-03-20 DIAGNOSIS — C482 Malignant neoplasm of peritoneum, unspecified: Secondary | ICD-10-CM

## 2022-03-31 DIAGNOSIS — H26493 Other secondary cataract, bilateral: Secondary | ICD-10-CM | POA: Diagnosis not present

## 2022-03-31 DIAGNOSIS — D3131 Benign neoplasm of right choroid: Secondary | ICD-10-CM | POA: Diagnosis not present

## 2022-03-31 DIAGNOSIS — H5319 Other subjective visual disturbances: Secondary | ICD-10-CM | POA: Diagnosis not present

## 2022-03-31 DIAGNOSIS — Z961 Presence of intraocular lens: Secondary | ICD-10-CM | POA: Diagnosis not present

## 2022-05-22 ENCOUNTER — Ambulatory Visit (HOSPITAL_COMMUNITY)
Admission: RE | Admit: 2022-05-22 | Discharge: 2022-05-22 | Disposition: A | Discharge status: Home / Self Care | Payer: Medicare PPO | Source: Ambulatory Visit | Attending: Internal Medicine | Admitting: Internal Medicine

## 2022-05-22 DIAGNOSIS — N281 Cyst of kidney, acquired: Secondary | ICD-10-CM | POA: Diagnosis not present

## 2022-05-22 DIAGNOSIS — C482 Malignant neoplasm of peritoneum, unspecified: Secondary | ICD-10-CM | POA: Diagnosis not present

## 2022-05-22 MED ORDER — IOHEXOL 300 MG/ML  SOLN
100.0000 mL | Freq: Once | INTRAMUSCULAR | Status: AC | PRN
  Administered 2022-05-22: 80 mL via INTRAVENOUS

## 2022-06-03 DIAGNOSIS — Z23 Encounter for immunization: Secondary | ICD-10-CM | POA: Diagnosis not present

## 2022-06-18 DIAGNOSIS — N183 Chronic kidney disease, stage 3 unspecified: Secondary | ICD-10-CM | POA: Diagnosis not present

## 2022-06-18 DIAGNOSIS — K219 Gastro-esophageal reflux disease without esophagitis: Secondary | ICD-10-CM | POA: Diagnosis not present

## 2022-06-18 DIAGNOSIS — Z681 Body mass index (BMI) 19 or less, adult: Secondary | ICD-10-CM | POA: Diagnosis not present

## 2022-06-18 DIAGNOSIS — E039 Hypothyroidism, unspecified: Secondary | ICD-10-CM | POA: Diagnosis not present

## 2022-06-18 DIAGNOSIS — Z299 Encounter for prophylactic measures, unspecified: Secondary | ICD-10-CM | POA: Diagnosis not present

## 2022-06-18 DIAGNOSIS — Z0000 Encounter for general adult medical examination without abnormal findings: Secondary | ICD-10-CM | POA: Diagnosis not present

## 2022-06-30 DIAGNOSIS — Z1231 Encounter for screening mammogram for malignant neoplasm of breast: Secondary | ICD-10-CM | POA: Diagnosis not present

## 2022-07-07 DIAGNOSIS — Z681 Body mass index (BMI) 19 or less, adult: Secondary | ICD-10-CM | POA: Diagnosis not present

## 2022-07-07 DIAGNOSIS — R350 Frequency of micturition: Secondary | ICD-10-CM | POA: Diagnosis not present

## 2022-07-07 DIAGNOSIS — K529 Noninfective gastroenteritis and colitis, unspecified: Secondary | ICD-10-CM | POA: Diagnosis not present

## 2022-07-07 DIAGNOSIS — N390 Urinary tract infection, site not specified: Secondary | ICD-10-CM | POA: Diagnosis not present

## 2022-07-07 DIAGNOSIS — E039 Hypothyroidism, unspecified: Secondary | ICD-10-CM | POA: Diagnosis not present

## 2022-07-07 DIAGNOSIS — Z299 Encounter for prophylactic measures, unspecified: Secondary | ICD-10-CM | POA: Diagnosis not present

## 2022-07-11 DIAGNOSIS — R109 Unspecified abdominal pain: Secondary | ICD-10-CM | POA: Diagnosis not present

## 2022-07-11 DIAGNOSIS — I700 Atherosclerosis of aorta: Secondary | ICD-10-CM | POA: Diagnosis not present

## 2022-08-04 DIAGNOSIS — M810 Age-related osteoporosis without current pathological fracture: Secondary | ICD-10-CM | POA: Diagnosis not present

## 2022-08-27 DIAGNOSIS — J029 Acute pharyngitis, unspecified: Secondary | ICD-10-CM | POA: Diagnosis not present

## 2022-08-27 DIAGNOSIS — R519 Headache, unspecified: Secondary | ICD-10-CM | POA: Diagnosis not present

## 2022-08-27 DIAGNOSIS — R110 Nausea: Secondary | ICD-10-CM | POA: Diagnosis not present

## 2022-08-27 DIAGNOSIS — J329 Chronic sinusitis, unspecified: Secondary | ICD-10-CM | POA: Diagnosis not present

## 2022-08-27 DIAGNOSIS — Z299 Encounter for prophylactic measures, unspecified: Secondary | ICD-10-CM | POA: Diagnosis not present

## 2022-09-15 DIAGNOSIS — L820 Inflamed seborrheic keratosis: Secondary | ICD-10-CM | POA: Diagnosis not present

## 2022-09-15 DIAGNOSIS — D485 Neoplasm of uncertain behavior of skin: Secondary | ICD-10-CM | POA: Diagnosis not present

## 2022-09-15 DIAGNOSIS — L570 Actinic keratosis: Secondary | ICD-10-CM | POA: Diagnosis not present

## 2022-09-15 DIAGNOSIS — L814 Other melanin hyperpigmentation: Secondary | ICD-10-CM | POA: Diagnosis not present

## 2022-09-15 DIAGNOSIS — Z85828 Personal history of other malignant neoplasm of skin: Secondary | ICD-10-CM | POA: Diagnosis not present

## 2022-09-15 DIAGNOSIS — L821 Other seborrheic keratosis: Secondary | ICD-10-CM | POA: Diagnosis not present

## 2022-09-15 DIAGNOSIS — C44712 Basal cell carcinoma of skin of right lower limb, including hip: Secondary | ICD-10-CM | POA: Diagnosis not present

## 2022-09-15 DIAGNOSIS — D220 Melanocytic nevi of lip: Secondary | ICD-10-CM | POA: Diagnosis not present

## 2022-09-29 DIAGNOSIS — Z79899 Other long term (current) drug therapy: Secondary | ICD-10-CM | POA: Diagnosis not present

## 2022-09-29 DIAGNOSIS — R5383 Other fatigue: Secondary | ICD-10-CM | POA: Diagnosis not present

## 2022-09-29 DIAGNOSIS — Z299 Encounter for prophylactic measures, unspecified: Secondary | ICD-10-CM | POA: Diagnosis not present

## 2022-09-29 DIAGNOSIS — E559 Vitamin D deficiency, unspecified: Secondary | ICD-10-CM | POA: Diagnosis not present

## 2022-09-29 DIAGNOSIS — Z681 Body mass index (BMI) 19 or less, adult: Secondary | ICD-10-CM | POA: Diagnosis not present

## 2022-09-29 DIAGNOSIS — Z1339 Encounter for screening examination for other mental health and behavioral disorders: Secondary | ICD-10-CM | POA: Diagnosis not present

## 2022-09-29 DIAGNOSIS — Z7189 Other specified counseling: Secondary | ICD-10-CM | POA: Diagnosis not present

## 2022-09-29 DIAGNOSIS — E039 Hypothyroidism, unspecified: Secondary | ICD-10-CM | POA: Diagnosis not present

## 2022-09-29 DIAGNOSIS — Z0000 Encounter for general adult medical examination without abnormal findings: Secondary | ICD-10-CM | POA: Diagnosis not present

## 2022-09-29 DIAGNOSIS — I700 Atherosclerosis of aorta: Secondary | ICD-10-CM | POA: Diagnosis not present

## 2022-09-29 DIAGNOSIS — Z1331 Encounter for screening for depression: Secondary | ICD-10-CM | POA: Diagnosis not present

## 2022-10-24 NOTE — Progress Notes (Signed)
Date:  11/05/2022   ID:  ENDA TO, DOB [birth year], MRN [id number]  PCP:  Monico Blitz, MD  Cardiologist:   Johnsie Cancel Electrophysiologist:  None   Evaluation Performed:  Follow-Up Visit  Chief Complaint:  SVT  History of Present Illness:    77 y.o. first seen 03/22/19 for SVT. Referred by Dr Manuella Ghazi. History of breast cancer, primary peritoneal carcinomatosis, thyroid disease. Also takes statin for HLD  Echo done 02/14/19 normal EF 55-60% no valve disease ECG showed SR with isolated PVC and normal QT rate 93  TSH/T4 have been normal Monitor ordered by primary reviewed and showed 8% PaC;s with rare  SVT only 6-8 beats She was started on beta blockers.   She lives alone Has sister in [place] Has had multiple somatic complaints that did not correlate with arrhythmia on monitor and include tightness in chest, fatigue and dyspnea She subsequently had normal myovue done 03/28/19 with EF >65%   Still with episodes of throat tightness, fatigue and palpitations can last 20 minutes to 1.5 hours Suspect it may be anxiety related to the fact that she has had cancer twice  Seen by Dr Lovena Le 03/03/22 and felt benign PAC;s with no PAF and no need for further w/u  Labs with Dr Brigitte Pulse she indicates LDL high but she does not want to take more Zocor    Past Medical History:  Diagnosis Date   Bowel obstruction ([facility])    2015   BRCA negative    Breast cancer ([facility])    Cataract    GERD (gastroesophageal reflux disease)    Hypercholesterolemia    Osteoporosis    Peritoneal carcinoma ([facility]) 2015   Primary peritoneal carcinomatosis ([facility])    presumed Stage 2C   Thyroid disease    Tubular adenoma of colon    Past Surgical History:  Procedure Laterality Date   abdominal laparoscopic surgery  2015   bowel obstruction   BREAST LUMPECTOMY  2003   bilateral   COLONOSCOPY     MASTECTOMY Bilateral    1967-2003 -Multiple biopsies   portcath      TOTAL ABDOMINAL HYSTERECTOMY W/ BILATERAL  SALPINGOOPHORECTOMY  2015   UPPER GASTROINTESTINAL ENDOSCOPY     wisdom teth extraction       Current Meds  Medication Sig   azelastine (ASTELIN) 0.1 % nasal spray Place into the nose 2 (two) times daily as needed.    denosumab (PROLIA) 60 MG/ML SOLN injection Inject 60 mg into the skin every 6 (six) months. Administer in upper arm, thigh, or abdomen   levothyroxine (SYNTHROID, LEVOTHROID) 25 MCG tablet Take 25 mcg by mouth daily.    metoprolol succinate (TOPROL-XL) 25 MG 24 hr tablet Take 25 mg by mouth daily.   Multiple Vitamin (MULTIVITAMIN) tablet Take 1 tablet by mouth daily.   omeprazole (PRILOSEC) 20 MG capsule Take 20 mg by mouth daily.   polyethylene glycol (MIRALAX / GLYCOLAX) packet Take by mouth 2 (two) times daily.    simvastatin (ZOCOR) 20 MG tablet Take 20 mg by mouth at bedtime.     Allergies:   Penicillins, Tramadol, and Keflex [cephalexin]   Social History   Tobacco Use   Smoking status: Former    Types: Cigarettes    Quit date: 05/04/1978    Years since quitting: 44.5   Smokeless tobacco: Never  Vaping Use   Vaping Use: Never used  Substance Use Topics   Alcohol use: No    Alcohol/week: 0.0 standard drinks of alcohol  Drug use: No     Family Hx: The patient's family history includes Colon cancer in her father; Skin cancer in her father; Stomach cancer in her maternal aunt. There is no history of Esophageal cancer, Pancreatic cancer, Prostate cancer, or Rectal cancer.  ROS:   Please see the history of present illness.     All other systems reviewed and are negative.   Prior CV studies:   The following studies were reviewed today:  Myovue July 2020 Echo/Monitor Primary June 2020  Labs/Other Tests and Data Reviewed:    EKG:   11/05/2022 NSR rate 66 normal 11/05/2022 NSR rate 78 nonspecific ST changes   Recent Labs: No results found for requested labs within last 365 days.   Recent Lipid Panel No results found for: "CHOL", "TRIG", "HDL",  "CHOLHDL", "LDLCALC", "LDLDIRECT"  Wt Readings from Last 3 Encounters:  11/05/22 121 lb (54.9 kg)  03/03/22 120 lb 3.2 oz (54.5 kg)  11/04/21 122 lb (55.3 kg)     Objective:    Vital Signs:  BP 126/88   Pulse 78   Ht 5' 6.5" (1.689 m)   Wt 121 lb (54.9 kg)   SpO2 97%   BMI 19.24 kg/m    Affect appropriate Healthy:  appears stated age HEENT: normal Neck supple with no adenopathy JVP normal no bruits no thyromegaly Lungs clear with no wheezing and good diaphragmatic motion Heart:  S1/S2 no murmur, no rub, gallop or click PMI normal Abdomen: benighn, BS positve, no tenderness, no AAA no bruit.  No HSM or HJR Distal pulses intact with no bruits No edema Neuro non-focal Skin warm and dry No muscular weakness   ASSESSMENT & PLAN:    1. Chest pain:  Atypical normal myovue 03/28/19 observe 2. Dyspnea:  Non cardiac echo 02/14/19 normal  3. Atrial Arrhythmias:  PaC;s and short bursts SVT continue beta blocker PRN Inderal  4. Thyroid:  Continue current synthroid dose labs with primary  5. HLD:  On statin will get calcium score to risk stratify further for CAD and guide strictness of statin Rx since she does not want to increase dose  6. Primary Peritoneal Carcinomatosis:  Post chemo/XRT CT 07/07/19 stable f/u oncology encouraged Her to have CT ordered through primary if not going to f/u with oncology CT abdomen normal with no recurrence 05/24/22 Will order CA 125 since not done since 2020    Medication Adjustments/Labs and Tests Ordered: Current medicines are reviewed at length with the patient today.  Concerns regarding medicines are outlined above.   Tests Ordered:  CA 125 Calcium Score   Medication Changes:  None   Disposition:  Follow up  in a year  Signed, Jenkins Rouge, MD  11/05/2022 1:51 PM    [facility]

## 2022-10-30 ENCOUNTER — Encounter: Payer: Self-pay | Admitting: Internal Medicine

## 2022-11-05 ENCOUNTER — Ambulatory Visit: Payer: Medicare PPO | Attending: Cardiovascular Disease | Admitting: Cardiovascular Disease

## 2022-11-05 ENCOUNTER — Encounter: Payer: Self-pay | Admitting: Cardiovascular Disease

## 2022-11-05 VITALS — BP 126/88 | HR 78 | Ht 66.5 in | Wt 121.0 lb

## 2022-11-05 DIAGNOSIS — R079 Chest pain, unspecified: Secondary | ICD-10-CM

## 2022-11-05 DIAGNOSIS — C786 Secondary malignant neoplasm of retroperitoneum and peritoneum: Secondary | ICD-10-CM

## 2022-11-05 DIAGNOSIS — I471 Supraventricular tachycardia, unspecified: Secondary | ICD-10-CM | POA: Diagnosis not present

## 2022-11-05 DIAGNOSIS — E782 Mixed hyperlipidemia: Secondary | ICD-10-CM

## 2022-11-05 NOTE — Patient Instructions (Signed)
Medication Instructions:  Your physician recommends that you continue on your current medications as directed. Please refer to the Current Medication list given to you today.  *If you need a refill on your cardiac medications before your next appointment, please call your pharmacy*  Lab Work: Your physician recommends that you have lab work today- CA125  If you have labs (blood work) drawn today and your tests are completely normal, you will receive your results only by: [place] (if you have MyChart) OR A paper copy in the mail If you have any lab test that is abnormal or we need to change your treatment, we will call you to review the results.  Testing/Procedures: Cardiac CT scanning for calcium score, (CAT scanning), is a noninvasive, special x-ray that produces cross-sectional images of the body using x-rays and a computer. CT scans help physicians diagnose and treat medical conditions. For some CT exams, a contrast material is used to enhance visibility in the area of the body being studied. CT scans provide greater clarity and reveal more details than regular x-ray exams.  Follow-Up: At [facility], you and your health needs are our priority.  As part of our continuing mission to provide you with exceptional heart care, we have created designated Provider Care Teams.  These Care Teams include your primary Cardiologist (physician) and Advanced Practice Providers (APPs -  Physician Assistants and Nurse Practitioners) who all work together to provide you with the care you need, when you need it.  We recommend signing up for the patient portal called "MyChart".  Sign up information is provided on this After Visit Summary.  MyChart is used to connect with patients for Virtual Visits (Telemedicine).  Patients are able to view lab/test results, encounter notes, upcoming appointments, etc.  Non-urgent messages can be sent to your provider as well.   To learn more about what you can  do with MyChart, go to [email or web address].    Your next appointment:   1 year(s)  Provider:   Jenkins Rouge, MD

## 2022-11-06 LAB — CA 125: Cancer Antigen (CA) 125: 12.8 U/mL (ref 0.0–38.1)

## 2022-12-23 ENCOUNTER — Ambulatory Visit (HOSPITAL_COMMUNITY)
Admission: RE | Admit: 2022-12-23 | Discharge: 2022-12-23 | Disposition: A | Discharge status: Home / Self Care | Payer: Medicare PPO | Source: Ambulatory Visit | Attending: Cardiovascular Disease | Admitting: Cardiovascular Disease

## 2022-12-23 DIAGNOSIS — R079 Chest pain, unspecified: Secondary | ICD-10-CM | POA: Insufficient documentation

## 2022-12-25 DIAGNOSIS — D485 Neoplasm of uncertain behavior of skin: Secondary | ICD-10-CM | POA: Diagnosis not present

## 2022-12-25 DIAGNOSIS — L821 Other seborrheic keratosis: Secondary | ICD-10-CM | POA: Diagnosis not present

## 2022-12-25 DIAGNOSIS — Z85828 Personal history of other malignant neoplasm of skin: Secondary | ICD-10-CM | POA: Diagnosis not present

## 2022-12-25 DIAGNOSIS — C44329 Squamous cell carcinoma of skin of other parts of face: Secondary | ICD-10-CM | POA: Diagnosis not present

## 2023-01-20 DIAGNOSIS — Z6820 Body mass index (BMI) 20.0-20.9, adult: Secondary | ICD-10-CM | POA: Diagnosis not present

## 2023-01-20 DIAGNOSIS — Z299 Encounter for prophylactic measures, unspecified: Secondary | ICD-10-CM | POA: Diagnosis not present

## 2023-01-20 DIAGNOSIS — M810 Age-related osteoporosis without current pathological fracture: Secondary | ICD-10-CM | POA: Diagnosis not present

## 2023-02-05 DIAGNOSIS — M810 Age-related osteoporosis without current pathological fracture: Secondary | ICD-10-CM | POA: Diagnosis not present

## 2023-02-05 DIAGNOSIS — Z79899 Other long term (current) drug therapy: Secondary | ICD-10-CM | POA: Diagnosis not present

## 2023-06-08 DIAGNOSIS — E2839 Other primary ovarian failure: Secondary | ICD-10-CM | POA: Diagnosis not present

## 2023-06-19 DIAGNOSIS — Z23 Encounter for immunization: Secondary | ICD-10-CM | POA: Diagnosis not present

## 2023-07-02 DIAGNOSIS — Z1231 Encounter for screening mammogram for malignant neoplasm of breast: Secondary | ICD-10-CM | POA: Diagnosis not present

## 2023-07-14 DIAGNOSIS — I471 Supraventricular tachycardia, unspecified: Secondary | ICD-10-CM | POA: Diagnosis not present

## 2023-07-14 DIAGNOSIS — J399 Disease of upper respiratory tract, unspecified: Secondary | ICD-10-CM | POA: Diagnosis not present

## 2023-07-14 DIAGNOSIS — C482 Malignant neoplasm of peritoneum, unspecified: Secondary | ICD-10-CM | POA: Diagnosis not present

## 2023-07-14 DIAGNOSIS — I700 Atherosclerosis of aorta: Secondary | ICD-10-CM | POA: Diagnosis not present

## 2023-07-14 DIAGNOSIS — Z299 Encounter for prophylactic measures, unspecified: Secondary | ICD-10-CM | POA: Diagnosis not present

## 2023-07-28 DIAGNOSIS — J309 Allergic rhinitis, unspecified: Secondary | ICD-10-CM | POA: Diagnosis not present

## 2023-07-28 DIAGNOSIS — N183 Chronic kidney disease, stage 3 unspecified: Secondary | ICD-10-CM | POA: Diagnosis not present

## 2023-07-28 DIAGNOSIS — I700 Atherosclerosis of aorta: Secondary | ICD-10-CM | POA: Diagnosis not present

## 2023-07-28 DIAGNOSIS — Z299 Encounter for prophylactic measures, unspecified: Secondary | ICD-10-CM | POA: Diagnosis not present

## 2023-08-03 DIAGNOSIS — M810 Age-related osteoporosis without current pathological fracture: Secondary | ICD-10-CM | POA: Diagnosis not present

## 2023-08-04 DIAGNOSIS — M810 Age-related osteoporosis without current pathological fracture: Secondary | ICD-10-CM | POA: Diagnosis not present

## 2023-08-26 DIAGNOSIS — M542 Cervicalgia: Secondary | ICD-10-CM | POA: Diagnosis not present

## 2023-08-26 DIAGNOSIS — M810 Age-related osteoporosis without current pathological fracture: Secondary | ICD-10-CM | POA: Diagnosis not present

## 2023-08-26 DIAGNOSIS — Z299 Encounter for prophylactic measures, unspecified: Secondary | ICD-10-CM | POA: Diagnosis not present

## 2023-10-01 DIAGNOSIS — N183 Chronic kidney disease, stage 3 unspecified: Secondary | ICD-10-CM | POA: Diagnosis not present

## 2023-10-01 DIAGNOSIS — Z299 Encounter for prophylactic measures, unspecified: Secondary | ICD-10-CM | POA: Diagnosis not present

## 2023-10-01 DIAGNOSIS — E039 Hypothyroidism, unspecified: Secondary | ICD-10-CM | POA: Diagnosis not present

## 2023-10-01 DIAGNOSIS — E559 Vitamin D deficiency, unspecified: Secondary | ICD-10-CM | POA: Diagnosis not present

## 2023-10-01 DIAGNOSIS — Z79899 Other long term (current) drug therapy: Secondary | ICD-10-CM | POA: Diagnosis not present

## 2023-10-01 DIAGNOSIS — R5383 Other fatigue: Secondary | ICD-10-CM | POA: Diagnosis not present

## 2023-10-01 DIAGNOSIS — Z1331 Encounter for screening for depression: Secondary | ICD-10-CM | POA: Diagnosis not present

## 2023-10-01 DIAGNOSIS — E7800 Pure hypercholesterolemia, unspecified: Secondary | ICD-10-CM | POA: Diagnosis not present

## 2023-10-01 DIAGNOSIS — C482 Malignant neoplasm of peritoneum, unspecified: Secondary | ICD-10-CM | POA: Diagnosis not present

## 2023-10-01 DIAGNOSIS — Z0000 Encounter for general adult medical examination without abnormal findings: Secondary | ICD-10-CM | POA: Diagnosis not present

## 2023-10-01 DIAGNOSIS — Z7189 Other specified counseling: Secondary | ICD-10-CM | POA: Diagnosis not present

## 2023-10-01 DIAGNOSIS — Z1339 Encounter for screening examination for other mental health and behavioral disorders: Secondary | ICD-10-CM | POA: Diagnosis not present

## 2023-11-03 NOTE — Progress Notes (Signed)
 Date:  11/12/2023   ID:  Andrea Simpson, DOB 1946-06-07, MRN [id number]  PCP:  Kirstie Peri, MD  Cardiologist:   Eden Emms Electrophysiologist:  None   Evaluation Performed:  Follow-Up Visit  Chief Complaint:  SVT  History of Present Illness:    78 y.o. first seen 03/22/19 for SVT. Referred by Dr Sherryll Burger. History of breast cancer, primary peritoneal carcinomatosis, thyroid disease. Also takes statin for HLD  Echo done 02/14/19 normal EF 55-60% no valve disease ECG showed SR with isolated PVC and normal QT rate 93  TSH/T4 have been normal Monitor ordered by primary reviewed and showed 8% PaC;s with rare  SVT only 6-8 beats She was started on beta blockers.   She lives alone Has sister in [place] to have lunch with friends , play bridge and sing in church choir  Has had multiple somatic complaints that did not correlate with arrhythmia on monitor and include tightness in chest, fatigue and dyspnea She subsequently had normal myovue done 03/28/19 with EF >65%   Still with episodes of throat tightness, fatigue and palpitations can last 20 minutes to 1.5 hours Suspect it may be anxiety related to the fact that she has had cancer twice  Seen by Dr Ladona Ridgel 03/03/22 and felt benign PAC;s with no PAF and no need for further w/u  Calcium score only 38.5, 38 th percentile 12/23/22  CA 125 stable I last checked 10/2022 history of peritoneal carcinomatosis.  CT abdomen no signs of recurrence 05/22/22 Repeat at discretion of oncology.    Past Medical History:  Diagnosis Date   Bowel obstruction ([facility])    2015   BRCA negative    Breast cancer ([facility])    Cataract    GERD (gastroesophageal reflux disease)    Hypercholesterolemia    Osteoporosis    Peritoneal carcinoma ([facility]) 2015   Primary peritoneal carcinomatosis ([facility])    presumed Stage 2C   Thyroid disease    Tubular adenoma of colon    Past Surgical History:  Procedure Laterality Date   abdominal laparoscopic surgery  2015   bowel  obstruction   BREAST LUMPECTOMY  2003   bilateral   COLONOSCOPY     MASTECTOMY Bilateral    1967-2003 -Multiple biopsies   portcath      TOTAL ABDOMINAL HYSTERECTOMY W/ BILATERAL SALPINGOOPHORECTOMY  2015   UPPER GASTROINTESTINAL ENDOSCOPY     wisdom teth extraction       No outpatient medications have been marked as taking for the 11/12/23 encounter (Office Visit) with Wendall Stade, MD.     Allergies:   Penicillins, Tramadol, and Keflex [cephalexin]   Social History   Tobacco Use   Smoking status: Former    Current packs/day: 0.00    Types: Cigarettes    Quit date: 05/04/1978    Years since quitting: 45.5   Smokeless tobacco: Never  Vaping Use   Vaping status: Never Used  Substance Use Topics   Alcohol use: No    Alcohol/week: 0.0 standard drinks of alcohol   Drug use: No     Family Hx: The patient's family history includes Colon cancer in her father; Skin cancer in her father; Stomach cancer in her maternal aunt. There is no history of Esophageal cancer, Pancreatic cancer, Prostate cancer, or Rectal cancer.  ROS:   Please see the history of present illness.     All other systems reviewed and are negative.   Prior CV studies:   The following studies were  reviewed today:  Myovue July 2020 Echo/Monitor Primary June 2020  Labs/Other Tests and Data Reviewed:    EKG:   11/12/2023  SR RAD low voltage rate 80   Recent Lipid Panel No results found for: "CHOL", "TRIG", "HDL", "CHOLHDL", "LDLCALC", "LDLDIRECT"  Wt Readings from Last 3 Encounters:  11/12/23 125 lb (56.7 kg)  11/05/22 121 lb (54.9 kg)  03/03/22 120 lb 3.2 oz (54.5 kg)     Objective:    Vital Signs:  BP 118/78 (BP Location: Right Arm, Patient Position: Sitting, Cuff Size: Normal)   Pulse 80   Ht 5' 6.5" (1.689 m)   Wt 125 lb (56.7 kg)   SpO2 98%   BMI 19.87 kg/m    Affect appropriate Healthy:  appears stated age HEENT: normal Neck supple with no adenopathy JVP normal no bruits no  thyromegaly Lungs clear with no wheezing and good diaphragmatic motion Heart:  S1/S2 no murmur, no rub, gallop or click PMI normal Abdomen: benighn, BS positve, no tenderness, no AAA no bruit.  No HSM or HJR Distal pulses intact with no bruits No edema Neuro non-focal Skin warm and dry No muscular weakness   ASSESSMENT & PLAN:    1. Chest pain:  Atypical normal myovue 03/28/19 Calcium score only 38.5 mostly in LAD only 38 th percentile for age. Observe  2. Dyspnea:  Non cardiac echo 02/14/19 normal  will update 3. Atrial Arrhythmias:  PaC;s and short bursts SVT continue beta blocker PRN Inderal  4. Thyroid:  Continue current synthroid dose labs with primary  5. HLD:  On statin low calcium score continue current dose simvastatin 20 mg daily  6. Primary Peritoneal Carcinomatosis:  Post chemo/XRT CT 07/07/19 stable f/u oncology encouraged Her to have CT ordered through primary if not going to f/u with oncology CT abdomen normal with no recurrence 05/24/22 CA 125 normal and stable 11/05/22    Medication Adjustments/Labs and Tests Ordered: Current medicines are reviewed at length with the patient today.  Concerns regarding medicines are outlined above.   Tests Ordered:  None  Medication Changes:  None   Disposition:  Follow up  in a year  Signed, Charlton Haws, MD  11/12/2023 9:31 AM    [facility]

## 2023-11-12 ENCOUNTER — Ambulatory Visit: Payer: Medicare PPO | Attending: Cardiovascular Disease | Admitting: Cardiovascular Disease

## 2023-11-12 ENCOUNTER — Encounter: Payer: Self-pay | Admitting: Cardiovascular Disease

## 2023-11-12 VITALS — BP 118/78 | HR 80 | Ht 66.5 in | Wt 125.0 lb

## 2023-11-12 DIAGNOSIS — I491 Atrial premature depolarization: Secondary | ICD-10-CM

## 2023-11-12 DIAGNOSIS — E782 Mixed hyperlipidemia: Secondary | ICD-10-CM | POA: Diagnosis not present

## 2023-11-12 DIAGNOSIS — R079 Chest pain, unspecified: Secondary | ICD-10-CM

## 2023-11-12 DIAGNOSIS — C786 Secondary malignant neoplasm of retroperitoneum and peritoneum: Secondary | ICD-10-CM | POA: Diagnosis not present

## 2023-11-12 NOTE — Patient Instructions (Signed)
 Medication Instructions:  Your physician recommends that you continue on your current medications as directed. Please refer to the Current Medication list given to you today.  *If you need a refill on your cardiac medications before your next appointment, please call your pharmacy*   Lab Work: NONE   If you have labs (blood work) drawn today and your tests are completely normal, you will receive your results only by: MyChart Message (if you have MyChart) OR A paper copy in the mail If you have any lab test that is abnormal or we need to change your treatment, we will call you to review the results.   Testing/Procedures: NONE    Follow-Up: At [facility], you and your health needs are our priority.  As part of our continuing mission to provide you with exceptional heart care, we have created designated Provider Care Teams.  These Care Teams include your primary Cardiologist (physician) and Advanced Practice Providers (APPs -  Physician Assistants and Nurse Practitioners) who all work together to provide you with the care you need, when you need it.  We recommend signing up for the patient portal called "MyChart".  Sign up information is provided on this After Visit Summary.  MyChart is used to connect with patients for Virtual Visits (Telemedicine).  Patients are able to view lab/test results, encounter notes, upcoming appointments, etc.  Non-urgent messages can be sent to your provider as well.   To learn more about what you can do with MyChart, go to [email or web address].    Your next appointment:   1 year(s)  Provider:   You may see Charlton Haws, MD or one of the following Advanced Practice Providers on your designated Care Team:   Randall An, PA-C  Jacolyn Reedy, PA-C     Other Instructions Thank you for choosing [facility]!

## 2023-12-02 DIAGNOSIS — D225 Melanocytic nevi of trunk: Secondary | ICD-10-CM | POA: Diagnosis not present

## 2023-12-02 DIAGNOSIS — L820 Inflamed seborrheic keratosis: Secondary | ICD-10-CM | POA: Diagnosis not present

## 2023-12-02 DIAGNOSIS — L821 Other seborrheic keratosis: Secondary | ICD-10-CM | POA: Diagnosis not present

## 2023-12-02 DIAGNOSIS — D1801 Hemangioma of skin and subcutaneous tissue: Secondary | ICD-10-CM | POA: Diagnosis not present

## 2023-12-02 DIAGNOSIS — L570 Actinic keratosis: Secondary | ICD-10-CM | POA: Diagnosis not present

## 2023-12-02 DIAGNOSIS — Z85828 Personal history of other malignant neoplasm of skin: Secondary | ICD-10-CM | POA: Diagnosis not present

## 2023-12-02 DIAGNOSIS — L819 Disorder of pigmentation, unspecified: Secondary | ICD-10-CM | POA: Diagnosis not present

## 2023-12-02 DIAGNOSIS — L814 Other melanin hyperpigmentation: Secondary | ICD-10-CM | POA: Diagnosis not present

## 2023-12-18 DIAGNOSIS — N1831 Chronic kidney disease, stage 3a: Secondary | ICD-10-CM | POA: Diagnosis not present

## 2023-12-18 DIAGNOSIS — R42 Dizziness and giddiness: Secondary | ICD-10-CM | POA: Diagnosis not present

## 2023-12-18 DIAGNOSIS — Z299 Encounter for prophylactic measures, unspecified: Secondary | ICD-10-CM | POA: Diagnosis not present

## 2023-12-18 DIAGNOSIS — I10 Essential (primary) hypertension: Secondary | ICD-10-CM | POA: Diagnosis not present

## 2024-01-13 DIAGNOSIS — M810 Age-related osteoporosis without current pathological fracture: Secondary | ICD-10-CM | POA: Diagnosis not present

## 2024-02-03 DIAGNOSIS — M810 Age-related osteoporosis without current pathological fracture: Secondary | ICD-10-CM | POA: Diagnosis not present

## 2024-03-31 DIAGNOSIS — I471 Supraventricular tachycardia, unspecified: Secondary | ICD-10-CM | POA: Diagnosis not present

## 2024-03-31 DIAGNOSIS — Z299 Encounter for prophylactic measures, unspecified: Secondary | ICD-10-CM | POA: Diagnosis not present

## 2024-03-31 DIAGNOSIS — Z0000 Encounter for general adult medical examination without abnormal findings: Secondary | ICD-10-CM | POA: Diagnosis not present

## 2024-03-31 DIAGNOSIS — N183 Chronic kidney disease, stage 3 unspecified: Secondary | ICD-10-CM | POA: Diagnosis not present

## 2024-03-31 DIAGNOSIS — C482 Malignant neoplasm of peritoneum, unspecified: Secondary | ICD-10-CM | POA: Diagnosis not present

## 2024-04-04 DIAGNOSIS — E039 Hypothyroidism, unspecified: Secondary | ICD-10-CM | POA: Diagnosis not present

## 2024-05-30 DIAGNOSIS — H8110 Benign paroxysmal vertigo, unspecified ear: Secondary | ICD-10-CM | POA: Diagnosis not present

## 2024-05-30 DIAGNOSIS — R531 Weakness: Secondary | ICD-10-CM | POA: Diagnosis not present

## 2024-05-30 DIAGNOSIS — Z299 Encounter for prophylactic measures, unspecified: Secondary | ICD-10-CM | POA: Diagnosis not present

## 2024-05-30 DIAGNOSIS — I471 Supraventricular tachycardia, unspecified: Secondary | ICD-10-CM | POA: Diagnosis not present

## 2024-06-01 DIAGNOSIS — R42 Dizziness and giddiness: Secondary | ICD-10-CM | POA: Diagnosis not present

## 2024-06-13 DIAGNOSIS — N183 Chronic kidney disease, stage 3 unspecified: Secondary | ICD-10-CM | POA: Diagnosis not present

## 2024-06-13 DIAGNOSIS — Z299 Encounter for prophylactic measures, unspecified: Secondary | ICD-10-CM | POA: Diagnosis not present

## 2024-06-13 DIAGNOSIS — H8110 Benign paroxysmal vertigo, unspecified ear: Secondary | ICD-10-CM | POA: Diagnosis not present

## 2024-06-13 DIAGNOSIS — E039 Hypothyroidism, unspecified: Secondary | ICD-10-CM | POA: Diagnosis not present

## 2024-06-13 DIAGNOSIS — Z23 Encounter for immunization: Secondary | ICD-10-CM | POA: Diagnosis not present

## 2024-06-13 DIAGNOSIS — I10 Essential (primary) hypertension: Secondary | ICD-10-CM | POA: Diagnosis not present

## 2024-06-14 DIAGNOSIS — R42 Dizziness and giddiness: Secondary | ICD-10-CM | POA: Diagnosis not present

## 2024-07-07 DIAGNOSIS — Z1231 Encounter for screening mammogram for malignant neoplasm of breast: Secondary | ICD-10-CM | POA: Diagnosis not present

## 2024-07-12 DIAGNOSIS — M810 Age-related osteoporosis without current pathological fracture: Secondary | ICD-10-CM | POA: Diagnosis not present

## 2024-08-02 DIAGNOSIS — M810 Age-related osteoporosis without current pathological fracture: Secondary | ICD-10-CM | POA: Diagnosis not present

## 2024-09-11 ENCOUNTER — Emergency Department (HOSPITAL_COMMUNITY)
Admission: EM | Admit: 2024-09-11 | Discharge: 2024-09-12 | Disposition: A | Attending: Emergency Medicine | Admitting: Emergency Medicine

## 2024-09-11 ENCOUNTER — Other Ambulatory Visit: Payer: Self-pay

## 2024-09-11 ENCOUNTER — Encounter (HOSPITAL_COMMUNITY): Payer: Self-pay

## 2024-09-11 ENCOUNTER — Emergency Department (HOSPITAL_COMMUNITY)

## 2024-09-11 DIAGNOSIS — R002 Palpitations: Secondary | ICD-10-CM | POA: Diagnosis present

## 2024-09-11 DIAGNOSIS — N179 Acute kidney failure, unspecified: Secondary | ICD-10-CM | POA: Diagnosis not present

## 2024-09-11 DIAGNOSIS — Z853 Personal history of malignant neoplasm of breast: Secondary | ICD-10-CM | POA: Diagnosis not present

## 2024-09-11 DIAGNOSIS — I4891 Unspecified atrial fibrillation: Secondary | ICD-10-CM | POA: Diagnosis not present

## 2024-09-11 DIAGNOSIS — Z7901 Long term (current) use of anticoagulants: Secondary | ICD-10-CM | POA: Insufficient documentation

## 2024-09-11 DIAGNOSIS — R739 Hyperglycemia, unspecified: Secondary | ICD-10-CM

## 2024-09-11 DIAGNOSIS — Z8589 Personal history of malignant neoplasm of other organs and systems: Secondary | ICD-10-CM | POA: Insufficient documentation

## 2024-09-11 DIAGNOSIS — E039 Hypothyroidism, unspecified: Secondary | ICD-10-CM

## 2024-09-11 LAB — TSH: TSH: 12 u[IU]/mL — ABNORMAL HIGH (ref 0.350–4.500)

## 2024-09-11 LAB — CBC WITH DIFFERENTIAL/PLATELET
Abs Immature Granulocytes: 0.04 K/uL (ref 0.00–0.07)
Basophils Absolute: 0.1 K/uL (ref 0.0–0.1)
Basophils Relative: 1 %
Eosinophils Absolute: 0 K/uL (ref 0.0–0.5)
Eosinophils Relative: 0 %
HCT: 40.9 % (ref 36.0–46.0)
Hemoglobin: 13.4 g/dL (ref 12.0–15.0)
Immature Granulocytes: 0 %
Lymphocytes Relative: 11 %
Lymphs Abs: 1 K/uL (ref 0.7–4.0)
MCH: 29.8 pg (ref 26.0–34.0)
MCHC: 32.8 g/dL (ref 30.0–36.0)
MCV: 91.1 fL (ref 80.0–100.0)
Monocytes Absolute: 0.6 K/uL (ref 0.1–1.0)
Monocytes Relative: 7 %
Neutro Abs: 7.3 K/uL (ref 1.7–7.7)
Neutrophils Relative %: 81 %
Platelets: 174 K/uL (ref 150–400)
RBC: 4.49 MIL/uL (ref 3.87–5.11)
RDW: 12.7 % (ref 11.5–15.5)
WBC: 9.1 K/uL (ref 4.0–10.5)
nRBC: 0 % (ref 0.0–0.2)

## 2024-09-11 LAB — COMPREHENSIVE METABOLIC PANEL WITH GFR
ALT: 13 U/L (ref 0–44)
AST: 23 U/L (ref 15–41)
Albumin: 4.2 g/dL (ref 3.5–5.0)
Alkaline Phosphatase: 47 U/L (ref 38–126)
Anion gap: 7 (ref 5–15)
BUN: 23 mg/dL (ref 8–23)
CO2: 28 mmol/L (ref 22–32)
Calcium: 10.3 mg/dL (ref 8.9–10.3)
Chloride: 103 mmol/L (ref 98–111)
Creatinine, Ser: 1.13 mg/dL — ABNORMAL HIGH (ref 0.44–1.00)
GFR, Estimated: 49 mL/min — ABNORMAL LOW
Glucose, Bld: 137 mg/dL — ABNORMAL HIGH (ref 70–99)
Potassium: 3.8 mmol/L (ref 3.5–5.1)
Sodium: 138 mmol/L (ref 135–145)
Total Bilirubin: <0.2 mg/dL (ref 0.0–1.2)
Total Protein: 6.4 g/dL — ABNORMAL LOW (ref 6.5–8.1)

## 2024-09-11 LAB — MAGNESIUM: Magnesium: 2 mg/dL (ref 1.7–2.4)

## 2024-09-11 MED ORDER — ETOMIDATE 2 MG/ML IV SOLN
INTRAVENOUS | Status: AC | PRN
  Administered 2024-09-11: 10 mg via INTRAVENOUS

## 2024-09-11 MED ORDER — ETOMIDATE 2 MG/ML IV SOLN
10.0000 mg | Freq: Once | INTRAVENOUS | Status: AC
  Administered 2024-09-11: 10 mg via INTRAVENOUS
  Filled 2024-09-11: qty 10

## 2024-09-11 MED ORDER — APIXABAN 2.5 MG PO TABS
2.5000 mg | ORAL_TABLET | Freq: Once | ORAL | Status: AC
  Administered 2024-09-12: 2.5 mg via ORAL

## 2024-09-11 MED ORDER — APIXABAN 2.5 MG PO TABS: 2.5000 mg | ORAL_TABLET | Freq: Two times a day (BID) | ORAL | 0 refills | Status: DC

## 2024-09-11 MED ORDER — FENTANYL CITRATE (PF) 100 MCG/2ML IJ SOLN
50.0000 ug | Freq: Once | INTRAMUSCULAR | Status: AC
  Administered 2024-09-11: 50 ug via INTRAVENOUS
  Filled 2024-09-11: qty 2

## 2024-09-11 MED ORDER — APIXABAN 5 MG PO TABS
5.0000 mg | ORAL_TABLET | Freq: Once | ORAL | Status: DC
  Filled 2024-09-11: qty 1

## 2024-09-11 MED ORDER — FENTANYL CITRATE (PF) 100 MCG/2ML IJ SOLN
INTRAMUSCULAR | Status: AC | PRN
  Administered 2024-09-11: 50 ug via INTRAVENOUS

## 2024-09-11 MED ORDER — APIXABAN (ELIQUIS) EDUCATION KIT FOR DVT/PE PATIENTS: PACK | Freq: Once | Status: DC

## 2024-09-11 MED ORDER — ONDANSETRON HCL 4 MG/2ML IJ SOLN
4.0000 mg | Freq: Once | INTRAMUSCULAR | Status: AC
  Administered 2024-09-11: 4 mg via INTRAVENOUS
  Filled 2024-09-11: qty 2

## 2024-09-11 MED ORDER — SODIUM CHLORIDE 0.9 % IV BOLUS
1000.0000 mL | Freq: Once | INTRAVENOUS | Status: AC
  Administered 2024-09-11: 1000 mL via INTRAVENOUS

## 2024-09-11 NOTE — ED Triage Notes (Signed)
 Pt BIB [facility] for AFIB RVR. Pt denies chest pain and N/V. Pt was driving home and started feeling her heart beat fast. No prior hx of AFIB. Denies thinners. Pt ambulatory with EMS.

## 2024-09-11 NOTE — ED Provider Notes (Signed)
 Care assumed from Dr. Francesca, patient cardioverted out of atrial fibrillation being observed for recovery from sedation.  She had problems with nausea following sedation, did not respond to ondansetron .  I ordered a dose of prochlorperazine .  Following that, she is no longer having problems with nausea and I am discharging her with instructions to follow-up with cardiology and with her primary care provider.   Raford Lenis, MD 09/12/24 [phone]

## 2024-09-11 NOTE — Discharge Instructions (Addendum)
 We evaluated you for your palpitations.  Your heart rate was elevated and your heart rhythm was a condition called atrial fibrillation.  We performed a cardioversion (shock) to return your heart to a normal rhythm.  We have placed a referral for you to follow-up with a cardiologist.  They should call you in the next 48 to 72 hours.  Please call them if you do not hear back.  Atrial fibrillation does increase risk of stroke.  We have started you on a blood thinner called Eliquis .  Please take this twice daily.  Please continue to take your metoprolol.  Please immediately return to the emergency department if you develop any strokelike symptoms such as numbness or tingling, weakness on one side of your body or the other, facial droop, trouble swallowing, vision changes, trouble walking, or any other new symptoms.  Eliquis  can cause increased risk of bleeding.  If you notice any black stools or bloody stools, uncontrolled bleeding such as a nosebleed, vomiting blood or coughing up blood, please immediately return to the emergency department.  Your thyroid  test (TSH) was slightly elevated.  Please discuss this with your primary doctor as they may need to adjust your dose of thyroid  medication.  We think this is unlikely to be related to your atrial fibrillation.

## 2024-09-11 NOTE — ED Notes (Signed)
 ED Provider at bedside.

## 2024-09-11 NOTE — ED Provider Notes (Signed)
 " [facility] AT [facility] Provider Note  CSN: [id number] Arrival date & time: 09/11/24 2036  Chief Complaint(s) Atrial Fibrillation  HPI Andrea Simpson is a 79 y.o. female distant history of peritoneal carcinomatosis in remission, hyperlipidemia presenting to the emergency department with palpitation.  She reports sudden onset of palpitations just prior to arrival.  Reports some mild lightheadedness but no history of similar.  Does not take blood thinners.  She denies any chest pain, difficulty breathing, nausea or vomiting.  No syncope.  No back pain.  No leg swelling.  No fevers or chills.  She reports she feels well.   Past Medical History Past Medical History:  Diagnosis Date   Bowel obstruction (HCC)    2015   BRCA negative    Breast cancer (HCC)    Cataract    GERD (gastroesophageal reflux disease)    Hypercholesterolemia    Osteoporosis    Peritoneal carcinoma (HCC) 2015   Primary peritoneal carcinomatosis (HCC)    presumed Stage 2C   Thyroid  disease    Tubular adenoma of colon    Patient Active Problem List   Diagnosis Date Noted   Palpitations 03/03/2022   PAC (premature atrial contraction) 03/03/2022   Bilateral primary osteoarthritis of knee 04/17/2021   Dyslipidemia 04/30/2016   HX: breast cancer 04/30/2016   Osteoporosis 04/30/2016   BRCA negative 04/24/2015   Chemotherapy induced neutropenia 05/30/2014   Primary peritoneal carcinomatosis (HCC) 02/08/2014   Home Medication(s) Prior to Admission medications  Medication Sig Start Date End Date Taking? Authorizing Provider  apixaban  (ELIQUIS ) 2.5 MG TABS tablet Take 1 tablet (2.5 mg total) by mouth 2 (two) times daily. 09/11/24 10/11/24 Yes Francesca Elsie CROME, MD  azelastine (ASTELIN) 0.1 % nasal spray Place into the nose 2 (two) times daily as needed.     [provider]  denosumab  (PROLIA ) 60 MG/ML SOLN injection Inject 60 mg into the skin every 6 (six) months. Administer in  upper arm, thigh, or abdomen    [provider]  levothyroxine (SYNTHROID, LEVOTHROID) 25 MCG tablet Take 25 mcg by mouth daily.  02/18/17   [provider]  metoprolol succinate (TOPROL-XL) 25 MG 24 hr tablet Take 25 mg by mouth daily. 03/04/19   [provider]  Multiple Vitamin (MULTIVITAMIN) tablet Take 1 tablet by mouth daily.    [provider]  omeprazole  (PRILOSEC) 20 MG capsule Take 20 mg by mouth daily.    [provider]  polyethylene glycol (MIRALAX / GLYCOLAX) packet Take by mouth 2 (two) times daily.     [provider]  simvastatin (ZOCOR) 20 MG tablet Take 20 mg by mouth at bedtime.    [provider]                                                                                                                                    Past Surgical  History Past Surgical History:  Procedure Laterality Date   abdominal laparoscopic surgery  2015   bowel obstruction   BREAST LUMPECTOMY  2003   bilateral   COLONOSCOPY     MASTECTOMY Bilateral    1967-2003 -Multiple biopsies   portcath      TOTAL ABDOMINAL HYSTERECTOMY W/ BILATERAL SALPINGOOPHORECTOMY  2015   UPPER GASTROINTESTINAL ENDOSCOPY     wisdom teth extraction     Family History Family History  Problem Relation Age of Onset   Colon cancer Father        ? late 55's early 69's.. Died at age 19.   Skin cancer Father    Stomach cancer Maternal Aunt    Esophageal cancer Neg Hx    Pancreatic cancer Neg Hx    Prostate cancer Neg Hx    Rectal cancer Neg Hx     Social History Social History[1] Allergies Penicillins, Tramadol, and Keflex [cephalexin]  Review of Systems Review of Systems  All other systems reviewed and are negative.   Physical Exam Vital Signs  I have reviewed the triage vital signs BP 116/80   Pulse 89   Temp 98.1 F (36.7 C)   Resp 15   Wt 57.2 kg   SpO2 97%   BMI 20.03 kg/m  Physical Exam Vitals and nursing note reviewed.   Constitutional:      General: She is not in acute distress.    Appearance: She is well-developed.  HENT:     Head: Normocephalic and atraumatic.     Mouth/Throat:     Mouth: Mucous membranes are moist.  Eyes:     Pupils: Pupils are equal, round, and reactive to light.  Cardiovascular:     Rate and Rhythm: Tachycardia present. Rhythm irregular.     Heart sounds: No murmur heard. Pulmonary:     Effort: Pulmonary effort is normal. No respiratory distress.     Breath sounds: Normal breath sounds.  Abdominal:     General: Abdomen is flat.     Palpations: Abdomen is soft.     Tenderness: There is no abdominal tenderness.  Musculoskeletal:        General: No tenderness.     Right lower leg: No edema.     Left lower leg: No edema.  Skin:    General: Skin is warm and dry.  Neurological:     General: No focal deficit present.     Mental Status: She is alert. Mental status is at baseline.  Psychiatric:        Mood and Affect: Mood normal.        Behavior: Behavior normal.     ED Results and Treatments Labs (all labs ordered are listed, but only abnormal results are displayed) Labs Reviewed  COMPREHENSIVE METABOLIC PANEL WITH GFR - Abnormal; Notable for the following components:      Result Value   Glucose, Bld 137 (*)    Creatinine, Ser 1.13 (*)    Total Protein 6.4 (*)    GFR, Estimated 49 (*)    All other components within normal limits  TSH - Abnormal; Notable for the following components:   TSH 12.000 (*)    All other components within normal limits  CBC WITH DIFFERENTIAL/PLATELET  MAGNESIUM  T4, FREE  Radiology DG Chest Portable 1 View Result Date: 09/11/2024 EXAM: 1 VIEW(S) XRAY OF THE CHEST 09/11/2024 09:39:15 PM COMPARISON: Comparison with cardiac CT 12/23/2022. CLINICAL HISTORY: afb afb FINDINGS: LUNGS AND PLEURA: Emphysematous changes and  scattered fibrosis are suggested in the lungs. No airspace disease or consolidation is seen. No focal pulmonary opacity. No pleural effusion. No pneumothorax. HEART AND MEDIASTINUM: Heart size and pulmonary vascularity are normal. Mediastinal contours appear intact. BONES AND SOFT TISSUES: No acute osseous abnormality. IMPRESSION: 1. No airspace disease or consolidation is seen. 2. Emphysematous changes and scattered fibrosis are suggested in the lungs. Electronically signed by: Elsie Gravely MD 09/11/2024 09:48 PM EST RP Workstation: [id number]    Pertinent labs & imaging results that were available during my care of the patient were reviewed by me and considered in my medical decision making (see MDM for details).  Medications Ordered in ED Medications  apixaban  (ELIQUIS ) Education Kit for DVT/PE patients ( Does not apply Not Given 09/11/24 2334)  apixaban  (ELIQUIS ) tablet 2.5 mg (has no administration in time range)  sodium chloride  0.9 % bolus 1,000 mL (0 mLs Intravenous Stopped 09/11/24 2332)  etomidate  (AMIDATE ) injection 10 mg (10 mg Intravenous Given 09/11/24 2301)  fentaNYL  (SUBLIMAZE ) injection 50 mcg (50 mcg Intravenous Given 09/11/24 2310)  fentaNYL  (SUBLIMAZE ) injection (50 mcg Intravenous Given 09/11/24 2300)  etomidate  (AMIDATE ) injection (10 mg Intravenous Given 09/11/24 2301)  ondansetron  (ZOFRAN ) injection 4 mg (4 mg Intravenous Given 09/11/24 2333)                                                                                                                                     Procedures .Cardioversion  Date/Time: 09/11/2024 11:50 PM  Performed by: Francesca Elsie CROME, MD Authorized by: Francesca Elsie CROME, MD   Consent:    Consent obtained:  Verbal   Consent given by:  Patient   Risks discussed:  Induced arrhythmia, cutaneous burn, pain and death   Alternatives discussed:  Rate-control medication Universal protocol:    Procedure explained and questions answered to patient or  proxy's satisfaction: yes     Immediately prior to procedure a time out was called: yes     Patient identity confirmed:  Verbally with patient Pre-procedure details:    Cardioversion basis:  Emergent   Rhythm:  Atrial fibrillation   Electrode placement:  Anterior-posterior Patient sedated: Yes. Refer to sedation procedure documentation for details of sedation.  Attempt one:    Cardioversion mode:  Synchronous   Waveform:  Biphasic   Shock (Joules):  120   Shock outcome:  Conversion to normal sinus rhythm Post-procedure details:    Patient status:  Awake   Patient tolerance of procedure:  Tolerated well, no immediate complications .Sedation  Date/Time: 09/11/2024 11:51 PM  Performed by: Francesca Elsie CROME, MD Authorized by: Francesca Elsie CROME, MD   Consent:    Consent obtained:  Written and verbal   Consent  given by:  Patient   Risks discussed:  Allergic reaction, dysrhythmia, inadequate sedation, nausea, vomiting, respiratory compromise necessitating ventilatory assistance and intubation, prolonged sedation necessitating reversal and prolonged hypoxia resulting in organ damage   Alternatives discussed:  Analgesia without sedation Universal protocol:    Procedure explained and questions answered to patient or proxy's satisfaction: yes     Immediately prior to procedure, a time out was called: yes     Patient identity confirmed:  Verbally with patient Indications:    Procedure performed:  Cardioversion   Procedure necessitating sedation performed by:  Physician performing sedation Pre-sedation assessment:    Time since last food or drink:  4 hr   ASA classification: class 1 - normal, healthy patient     Mouth opening:  3 or more finger widths   Thyromental distance:  4 finger widths   Mallampati score:  I - soft palate, uvula, fauces, pillars visible   Neck mobility: normal     Pre-sedation assessments completed and reviewed: airway patency, cardiovascular function, hydration  status, mental status, nausea/vomiting, pain level, respiratory function and temperature   A pre-sedation assessment was completed prior to the start of the procedure Immediate pre-procedure details:    Reassessment: Patient reassessed immediately prior to procedure     Reviewed: vital signs, relevant labs/tests and NPO status     Verified: bag valve mask available, emergency equipment available, intubation equipment available, IV patency confirmed, oxygen available, reversal medications available and suction available   Procedure details (see MAR for exact dosages):    Preoxygenation:  Nasal cannula   Sedation:  Etomidate    Intended level of sedation: deep   Analgesia:  Fentanyl    Intra-procedure monitoring:  Blood pressure monitoring, cardiac monitor, continuous pulse oximetry, continuous capnometry, frequent LOC assessments and frequent vital sign checks   Intra-procedure events: none     Total Provider sedation time (minutes):  15 Post-procedure details:   A post-sedation assessment was completed following the completion of the procedure.   Attendance: Constant attendance by certified staff until patient recovered     Recovery: Patient returned to pre-procedure baseline     Post-sedation assessments completed and reviewed: airway patency, cardiovascular function, hydration status, mental status, nausea/vomiting, pain level, respiratory function and temperature     Patient is stable for discharge or admission: yes     Procedure completion:  Tolerated well, no immediate complications .Critical Care  Performed by: Francesca Elsie CROME, MD Authorized by: Francesca Elsie CROME, MD   Critical care provider statement:    Critical care time (minutes):  30   Critical care was necessary to treat or prevent imminent or life-threatening deterioration of the following conditions:  Cardiac failure   Critical care was time spent personally by me on the following activities:  Development of treatment  plan with patient or surrogate, discussions with consultants, evaluation of patient's response to treatment, examination of patient, ordering and review of laboratory studies, ordering and review of radiographic studies, ordering and performing treatments and interventions, pulse oximetry, re-evaluation of patient's condition and review of old charts   (including critical care time)  Medical Decision Making / ED Course   MDM:  79 year old presenting to the emergency department with palpitation.  EKG shows atrial fibrillation.  Low concern for atrial fibrillation due to underlying acute conditions such as infection, CHF.  She is very well-appearing denies any other concerning symptoms.  She reports acute onset of symptoms less than 12 hours ago.  Labs are reassuring including electrolytes, magnesium.  Discussed rate control versus cardioversion in ER.  Since patient has sudden onset of symptoms, and has presented immediately following onset, no history of stroke, feel patient would be a good candidate for ER cardioversion.  Explained that there would still be a risk of stroke as she is not on anticoagulation.  Primary benefit of cardioversion would be to prevent hospitalization.  Risks and benefits also were discussed of sedation and cardioversion.  Patient would like to proceed with cardioversion.  Will need anticoagulation following this.   Clinical Course as of 09/11/24 2352  [place] Sep 11, 2024  2348 Cardioversion performed today without incident.  EKG confirms transition to sinus rhythm.  Patient still feels a little sleepy from the medicines with some mild nausea but otherwise awake and alert, following commands, no deficit.  Will give Zofran  and first dose of Eliquis  in ER.  Signed out to oncoming provider Dr. Raford pending reassessment, once patient is able to ambulate and feels better can go home. [WS]    Clinical Course User Index [WS] Francesca Elsie CROME, MD     Additional history  obtained: -Additional history obtained from ems and friend -External records from outside source obtained and reviewed including: Chart review including previous notes, labs, imaging, consultation notes including prior notes    Lab Tests: -I ordered, reviewed, and interpreted labs.   The pertinent results include:   Labs Reviewed  COMPREHENSIVE METABOLIC PANEL WITH GFR - Abnormal; Notable for the following components:      Result Value   Glucose, Bld 137 (*)    Creatinine, Ser 1.13 (*)    Total Protein 6.4 (*)    GFR, Estimated 49 (*)    All other components within normal limits  TSH - Abnormal; Notable for the following components:   TSH 12.000 (*)    All other components within normal limits  CBC WITH DIFFERENTIAL/PLATELET  MAGNESIUM  T4, FREE    Notable for mild Aki   EKG   EKG Interpretation Date/Time:  Sunday September 11 2024 23:04:02 EST Ventricular Rate:  104 PR Interval:  191 QRS Duration:  77 QT Interval:  319 QTC Calculation: 420 R Axis:   84  Text Interpretation: Sinus tachycardia Atrial premature complex Borderline right axis deviation Low voltage, precordial leads Confirmed by Francesca Elsie ([id number]) on 09/11/2024 11:15:43 PM         Imaging Studies ordered: I ordered imaging studies including CXR On my interpretation imaging demonstrates no acute process I independently visualized and interpreted imaging. I agree with the radiologist interpretation   Medicines ordered and prescription drug management: Meds ordered this encounter  Medications   sodium chloride  0.9 % bolus 1,000 mL   etomidate  (AMIDATE ) injection 10 mg   fentaNYL  (SUBLIMAZE ) injection 50 mcg   fentaNYL  (SUBLIMAZE ) injection   etomidate  (AMIDATE ) injection   DISCONTD: apixaban  (ELIQUIS ) tablet 5 mg   apixaban  (ELIQUIS ) Education Kit for DVT/PE patients   ondansetron  (ZOFRAN ) injection 4 mg   apixaban  (ELIQUIS ) 2.5 MG TABS tablet    Sig: Take 1 tablet (2.5 mg total) by mouth 2  (two) times daily.    Dispense:  60 tablet    Refill:  0   apixaban  (ELIQUIS ) tablet 2.5 mg    -I have reviewed the patients home medicines and have made adjustments as needed    Cardiac Monitoring: The patient was maintained on a cardiac monitor.  I personally viewed and interpreted the cardiac monitored which showed an underlying rhythm of: afib   Social  Determinants of Health:  Diagnosis or treatment significantly limited by social determinants of health: lives alone   Reevaluation: After the interventions noted above, I reevaluated the patient and found that their symptoms have improved  Co morbidities that complicate the patient evaluation  Past Medical History:  Diagnosis Date   Bowel obstruction (HCC)    2015   BRCA negative    Breast cancer (HCC)    Cataract    GERD (gastroesophageal reflux disease)    Hypercholesterolemia    Osteoporosis    Peritoneal carcinoma (HCC) 2015   Primary peritoneal carcinomatosis (HCC)    presumed Stage 2C   Thyroid  disease    Tubular adenoma of colon       Dispostion: Disposition decision including need for hospitalization was considered, and patient disposition pending at time of sign out.    Final Clinical Impression(s) / ED Diagnoses Final diagnoses:  Atrial fibrillation with RVR (HCC)     This chart was dictated using voice recognition software.  Despite best efforts to proofread,  errors can occur which can change the documentation meaning.     [1]  Social History Tobacco Use   Smoking status: Former    Current packs/day: 0.00    Types: Cigarettes    Quit date: 05/04/1978    Years since quitting: 46.3   Smokeless tobacco: Never  Vaping Use   Vaping status: Never Used  Substance Use Topics   Alcohol  use: No    Alcohol /week: 0.0 standard drinks of alcohol    Drug use: No     Francesca Elsie CROME, MD 09/11/24 2352  "

## 2024-09-12 ENCOUNTER — Encounter: Payer: Self-pay | Admitting: Cardiovascular Disease

## 2024-09-12 ENCOUNTER — Ambulatory Visit: Admitting: Cardiovascular Disease

## 2024-09-12 VITALS — BP 132/80 | HR 81 | Ht 66.5 in | Wt 126.8 lb

## 2024-09-12 DIAGNOSIS — C786 Secondary malignant neoplasm of retroperitoneum and peritoneum: Secondary | ICD-10-CM

## 2024-09-12 DIAGNOSIS — R079 Chest pain, unspecified: Secondary | ICD-10-CM

## 2024-09-12 DIAGNOSIS — I491 Atrial premature depolarization: Secondary | ICD-10-CM | POA: Diagnosis not present

## 2024-09-12 DIAGNOSIS — E782 Mixed hyperlipidemia: Secondary | ICD-10-CM | POA: Diagnosis not present

## 2024-09-12 DIAGNOSIS — I480 Paroxysmal atrial fibrillation: Secondary | ICD-10-CM

## 2024-09-12 LAB — T4, FREE: Free T4: 1.37 ng/dL (ref 0.80–2.00)

## 2024-09-12 MED ORDER — APIXABAN 2.5 MG PO TABS: 2.5000 mg | ORAL_TABLET | Freq: Two times a day (BID) | ORAL | 6 refills | Status: AC

## 2024-09-12 MED ORDER — APIXABAN 2.5 MG PO TABS: 2.5000 mg | ORAL_TABLET | Freq: Two times a day (BID) | ORAL | 0 refills | Status: AC

## 2024-09-12 MED ORDER — PROCHLORPERAZINE EDISYLATE 10 MG/2ML IJ SOLN
10.0000 mg | Freq: Once | INTRAMUSCULAR | Status: AC
  Administered 2024-09-12: 10 mg via INTRAVENOUS
  Filled 2024-09-12: qty 2

## 2024-09-12 NOTE — ED Notes (Signed)
Pt ambulatory to bathroom and back to room 

## 2024-09-12 NOTE — Patient Instructions (Signed)
 Medication Instructions:  Your physician recommends that you continue on your current medications as directed. Please refer to the Current Medication list given to you today.  *If you need a refill on your cardiac medications before your next appointment, please call your pharmacy*  Lab Work: None today If you have labs (blood work) drawn today and your tests are completely normal, you will receive your results only by: MyChart Message (if you have MyChart) OR A paper copy in the mail If you have any lab test that is abnormal or we need to change your treatment, we will call you to review the results.  Testing/Procedures: Your physician has requested that you have an echocardiogram. Echocardiography is a painless test that uses sound waves to create images of your heart. It provides your doctor with information about the size and shape of your heart and how well your hearts chambers and valves are working. This procedure takes approximately one hour. There are no restrictions for this procedure. Please do NOT wear cologne, perfume, aftershave, or lotions (deodorant is allowed). Please arrive 15 minutes prior to your appointment time.  Please note: We ask at that you not bring children with you during ultrasound (echo/ vascular) testing. Due to room size and safety concerns, children are not allowed in the ultrasound rooms during exams. Our front office staff cannot provide observation of children in our lobby area while testing is being conducted. An adult accompanying a patient to their appointment will only be allowed in the ultrasound room at the discretion of the ultrasound technician under special circumstances. We apologize for any inconvenience.      Your physician has recommended that you wear an event monitor for 30 days. Event monitors are medical devices that record the hearts electrical activity. Doctors most often us  these monitors to diagnose arrhythmias. Arrhythmias are  problems with the speed or rhythm of the heartbeat. The monitor is a small, portable device. You can wear one while you do your normal daily activities. This is usually used to diagnose what is causing palpitations/syncope (passing out).   Follow-Up: At [facility], you and your health needs are our priority.  As part of our continuing mission to provide you with exceptional heart care, our providers are all part of one team.  This team includes your primary Cardiologist (physician) and Advanced Practice Providers or APPs (Physician Assistants and Nurse Practitioners) who all work together to provide you with the care you need, when you need it.  Your next appointment:   3 month(s)  Provider:   You may see Maude Emmer, MD or one of the following Advanced Practice Providers on your designated Care Team:   Laymon Qua, PA-C  [place], [place] Olivia Pavy, [place]     We recommend signing up for the patient portal called MyChart.  Sign up information is provided on this After Visit Summary.  MyChart is used to connect with patients for Virtual Visits (Telemedicine).  Patients are able to view lab/test results, encounter notes, upcoming appointments, etc.  Non-urgent messages can be sent to your provider as well.   To learn more about what you can do with MyChart, go to [email or web address].   Other Instructions None today   Samples Eliquis  2.5 mg given at visit today

## 2024-09-12 NOTE — Progress Notes (Signed)
 "    Date:  09/12/2024   ID:  Andrea Simpson, DOB 1946/06/25, MRN [id number]  PCP:  Maree Isles, MD  Cardiologist:   [place] Electrophysiologist:  None   Evaluation Performed:  Follow-Up Visit  Chief Complaint:  SVT  History of Present Illness:    79 y.o. first seen 03/22/19 for SVT. Referred by Dr Maree. History of breast cancer, primary peritoneal carcinomatosis, thyroid  disease. Also takes statin for HLD  Echo done 02/14/19 normal EF 55-60% no valve disease ECG showed SR with isolated PVC and normal QT rate 93  TSH/T4 have been normal Monitor ordered by primary reviewed and showed 8% PaC;s with rare  SVT only 6-8 beats She was started on beta blockers.   She lives alone Has sister in [place] to have lunch with friends , play bridge and sing in church choir  Has had multiple somatic complaints that did not correlate with arrhythmia on monitor and include tightness in chest, fatigue and dyspnea She subsequently had normal myovue done 03/28/19 with EF >65%   Still with episodes of throat tightness, fatigue and palpitations can last 20 minutes to 1.5 hours Suspect it may be anxiety related to the fact that she has had cancer twice  Seen by Dr Waddell 03/03/22 and felt benign PAC;s with no PAF and no need for further w/u  Calcium score only 38.5, 38 th percentile 12/23/22  CA 125 stable I last checked 10/2022 history of peritoneal carcinomatosis.  CT abdomen no signs of recurrence 05/22/22 Repeat at discretion of oncology.  Seen in ED last night with afib. Had [facility] and started on low dose eliquis  given low body weight and age. She is in NSR in office     Past Medical History:  Diagnosis Date   Bowel obstruction (HCC)    2015   BRCA negative    Breast cancer (HCC)    Cataract    GERD (gastroesophageal reflux disease)    Hypercholesterolemia    Osteoporosis    Peritoneal carcinoma (HCC) 2015   Primary peritoneal carcinomatosis (HCC)    presumed Stage 2C   Thyroid  disease    Tubular  adenoma of colon    Past Surgical History:  Procedure Laterality Date   abdominal laparoscopic surgery  2015   bowel obstruction   BREAST LUMPECTOMY  2003   bilateral   COLONOSCOPY     MASTECTOMY Bilateral    1967-2003 -Multiple biopsies   portcath      TOTAL ABDOMINAL HYSTERECTOMY W/ BILATERAL SALPINGOOPHORECTOMY  2015   UPPER GASTROINTESTINAL ENDOSCOPY     wisdom teth extraction       Current Meds  Medication Sig   apixaban  (ELIQUIS ) 2.5 MG TABS tablet Take 1 tablet (2.5 mg total) by mouth 2 (two) times daily.   azelastine (ASTELIN) 0.1 % nasal spray Place into the nose 2 (two) times daily as needed.    denosumab  (PROLIA ) 60 MG/ML SOLN injection Inject 60 mg into the skin every 6 (six) months. Administer in upper arm, thigh, or abdomen   levothyroxine (SYNTHROID, LEVOTHROID) 25 MCG tablet Take 25 mcg by mouth daily.    metoprolol succinate (TOPROL-XL) 25 MG 24 hr tablet Take 25 mg by mouth daily.   Multiple Vitamin (MULTIVITAMIN) tablet Take 1 tablet by mouth daily.   omeprazole  (PRILOSEC) 20 MG capsule Take 20 mg by mouth daily.   polyethylene glycol (MIRALAX / GLYCOLAX) packet Take by mouth 2 (two) times daily.    simvastatin (ZOCOR) 20 MG tablet  Take 20 mg by mouth at bedtime.     Allergies:   Penicillins, Tramadol, and Keflex [cephalexin]   Social History   Tobacco Use   Smoking status: Former    Current packs/day: 0.00    Types: Cigarettes    Quit date: 05/04/1978    Years since quitting: 46.3   Smokeless tobacco: Never  Vaping Use   Vaping status: Never Used  Substance Use Topics   Alcohol  use: No    Alcohol /week: 0.0 standard drinks of alcohol    Drug use: No     Family Hx: The patient's family history includes Colon cancer in her father; Skin cancer in her father; Stomach cancer in her maternal aunt. There is no history of Esophageal cancer, Pancreatic cancer, Prostate cancer, or Rectal cancer.  ROS:   Please see the history of present illness.     All  other systems reviewed and are negative.   Prior CV studies:   The following studies were reviewed today:  Myovue July 2020 Echo/Monitor Primary June 2020  Labs/Other Tests and Data Reviewed:    EKG:   09/12/2024  SR rate 70 normal   Recent Lipid Panel No results found for: CHOL, TRIG, HDL, CHOLHDL, LDLCALC, LDLDIRECT  Wt Readings from Last 3 Encounters:  09/12/24 126 lb 12.8 oz (57.5 kg)  09/11/24 126 lb (57.2 kg)  11/12/23 125 lb (56.7 kg)     Objective:    Vital Signs:  BP 132/80 (BP Location: Left Arm, Cuff Size: Normal)   Pulse 81   Ht 5' 6.5 (1.689 m)   Wt 126 lb 12.8 oz (57.5 kg)   SpO2 99%   BMI 20.16 kg/m    Affect appropriate Healthy:  appears stated age HEENT: normal Neck supple with no adenopathy JVP normal no bruits no thyromegaly Lungs clear with no wheezing and good diaphragmatic motion Heart:  S1/S2 no murmur, no rub, gallop or click PMI normal Abdomen: benighn, BS positve, no tenderness, no AAA no bruit.  No HSM or HJR Distal pulses intact with no bruits No edema Neuro non-focal Skin warm and dry No muscular weakness   ASSESSMENT & PLAN:    1. Chest pain:  Atypical normal myovue 03/28/19 Calcium score only 38.5 mostly in LAD only 38 th percentile for age. Observe  2. Dyspnea:  Non cardiac echo 02/14/19 normal  will update 3. PAF:  CHADVASC 3.  On eliquis  2.5 bid and Toprol 30 day monitor and echo ordered  4. Thyroid : TSH 12 in ER consider increasing synthroid to 50ug daily per primary   5. HLD:  On statin low calcium score continue current dose simvastatin 20 mg daily  6. Primary Peritoneal Carcinomatosis:  Post chemo/XRT CT 07/07/19 stable f/u oncology encouraged Her to have CT ordered through primary if not going to f/u with oncology CT abdomen normal with no recurrence 05/24/22 CA 125 normal and stable 11/05/22    Medication Adjustments/Labs and Tests Ordered: Current medicines are reviewed at length with the patient today.   Concerns regarding medicines are outlined above.   Tests Ordered:  30 day monitor TTE  Medication Changes:  None   Disposition:  Follow up in 3 months pending TTE/monitor  Signed, Maude Emmer, MD  09/12/2024 1:14 PM    [facility] "

## 2024-09-12 NOTE — ED Notes (Signed)
 Pt tolerated ice chips after compazine 

## 2024-09-15 ENCOUNTER — Telehealth: Payer: Self-pay | Admitting: Cardiovascular Disease

## 2024-09-15 NOTE — Telephone Encounter (Signed)
 Pt requesting a nurse visit to get help putting on her heart monitor

## 2024-09-15 NOTE — Telephone Encounter (Signed)
 Patient returned Hailey's call

## 2024-09-15 NOTE — Telephone Encounter (Signed)
 Patient received [employer] monitor in mail, wants to have it applied after her echo on 09/27/24 in the [place] office. (She lives in [place])   I will speak with staff there and arrange.

## 2024-09-16 NOTE — Telephone Encounter (Signed)
 Advised to come to the office today at 1:00 pm to get assistance placing heart monitor. Advised that a new patch could be placed after Echo. Verbalized understanding.

## 2024-09-16 NOTE — Telephone Encounter (Signed)
 Spoke to patient who stated that she was wondering if she could get a second transmitter sent to her since when she's worn a monitor before she received 2 separate transmitters so that one can charge while she wears the second. Advised pt that she can disconnect her transmitter and plug it in to charge for a period of time during the day as long as she puts it back on. Patient was agreeable with the idea and had no further questions or concerns at this time.

## 2024-09-16 NOTE — Telephone Encounter (Signed)
 Patient wants to get a second heart monitor ordered and wants a call back regarding next steps.

## 2024-09-22 ENCOUNTER — Ambulatory Visit: Attending: Cardiovascular Disease

## 2024-09-22 DIAGNOSIS — I480 Paroxysmal atrial fibrillation: Secondary | ICD-10-CM

## 2024-09-27 ENCOUNTER — Ambulatory Visit: Attending: Cardiovascular Disease

## 2024-09-27 ENCOUNTER — Ambulatory Visit: Payer: Self-pay | Admitting: Cardiovascular Disease

## 2024-09-27 DIAGNOSIS — I480 Paroxysmal atrial fibrillation: Secondary | ICD-10-CM

## 2024-09-27 LAB — ECHOCARDIOGRAM COMPLETE
AR max vel: 3.01 cm2
AV Peak grad: 6.1 mmHg
Ao pk vel: 1.23 m/s
Area-P 1/2: 3.11 cm2
Calc EF: 61.2 %
S' Lateral: 2.5 cm
Single Plane A2C EF: 60.9 %
Single Plane A4C EF: 60.2 %

## 2024-09-28 NOTE — Telephone Encounter (Signed)
Patient called to follow-up on test results. 

## 2024-09-29 NOTE — Telephone Encounter (Signed)
 Pt calling for results

## 2024-09-29 NOTE — Telephone Encounter (Signed)
 Patient was returning call. Please advise ?

## 2024-10-13 ENCOUNTER — Telehealth: Payer: Self-pay | Admitting: Cardiovascular Disease

## 2024-10-13 NOTE — Telephone Encounter (Signed)
" ° ° °  Primary Cardiologist: Maude Emmer, MD  Chart reviewed as part of pre-operative protocol coverage. Simple dental extractions are considered low risk procedures per guidelines and generally do not require any specific cardiac clearance. It is also generally accepted that for simple extractions and dental cleanings, there is no need to interrupt blood thinner therapy.   SBE prophylaxis is not required for the patient.  I will route this recommendation to the requesting party via Epic fax function and remove from pre-op pool.  Please call with questions.  Orren LOISE Fabry, PA-C 10/13/2024, 11:22 AM      "

## 2024-10-13 NOTE — Telephone Encounter (Signed)
"  ° °  Pre-operative Risk Assessment    Patient Name: Andrea Simpson  DOB: 1946-01-08 MRN: [id number]   Date of last office visit: 09/12/24 Date of next office visit: 12/02/24  Request for Surgical Clearance    Procedure:  Cleaning and Tooth Smoothing  Date of Surgery:  Clearance 10/13/24                                Surgeon:  Dr. Venetia Gelineau Surgeon's Group or Practice Name:  [facility] Phone number:  [phone]  Fax number:  [phone]   Type of Clearance Requested:   - Pharmacy:  Hold Apixaban  (Eliquis )     Type of Anesthesia:  None    Additional requests/questions:  Caller Young) stated patient is in the office now and want to know if patient can have dental cleaning and procedure.  Signed, Jasmin B Wilson   10/13/2024, 10:50 AM   "

## 2024-11-11 ENCOUNTER — Ambulatory Visit: Admitting: Cardiovascular Disease

## 2024-12-02 ENCOUNTER — Ambulatory Visit: Admitting: Cardiovascular Disease
